# Patient Record
Sex: Female | Born: 1950 | Smoking: Never smoker
Health system: Southern US, Community
[De-identification: ages and names within clinical notes are randomized; demographics above are authoritative.]

## PROBLEM LIST (undated history)

## (undated) DIAGNOSIS — G35D Multiple sclerosis, unspecified: Secondary | ICD-10-CM

## (undated) DIAGNOSIS — G35 Multiple sclerosis: Secondary | ICD-10-CM

## (undated) DIAGNOSIS — D649 Anemia, unspecified: Secondary | ICD-10-CM

## (undated) DIAGNOSIS — F329 Major depressive disorder, single episode, unspecified: Secondary | ICD-10-CM

---

## 2015-12-18 ENCOUNTER — Emergency Department (HOSPITAL_COMMUNITY): Payer: Medicare Other

## 2015-12-18 ENCOUNTER — Encounter (HOSPITAL_COMMUNITY): Payer: Self-pay | Admitting: Nurse Practitioner

## 2015-12-18 ENCOUNTER — Inpatient Hospital Stay (HOSPITAL_COMMUNITY)
Admission: EM | Admit: 2015-12-18 | Discharge: 2015-12-28 | DRG: 871 | Disposition: A | Discharge status: Skilled Nursing Facility | Payer: Medicare Other | Attending: Internal Medicine | Admitting: Internal Medicine

## 2015-12-18 DIAGNOSIS — R740 Nonspecific elevation of levels of transaminase and lactic acid dehydrogenase [LDH]: Secondary | ICD-10-CM | POA: Diagnosis not present

## 2015-12-18 DIAGNOSIS — E860 Dehydration: Secondary | ICD-10-CM | POA: Diagnosis present

## 2015-12-18 DIAGNOSIS — L89154 Pressure ulcer of sacral region, stage 4: Secondary | ICD-10-CM | POA: Diagnosis not present

## 2015-12-18 DIAGNOSIS — L8990 Pressure ulcer of unspecified site, unspecified stage: Secondary | ICD-10-CM | POA: Diagnosis not present

## 2015-12-18 DIAGNOSIS — E870 Hyperosmolality and hypernatremia: Secondary | ICD-10-CM | POA: Diagnosis not present

## 2015-12-18 DIAGNOSIS — R52 Pain, unspecified: Secondary | ICD-10-CM | POA: Diagnosis present

## 2015-12-18 DIAGNOSIS — Y95 Nosocomial condition: Secondary | ICD-10-CM | POA: Diagnosis present

## 2015-12-18 DIAGNOSIS — Z66 Do not resuscitate: Secondary | ICD-10-CM | POA: Diagnosis not present

## 2015-12-18 DIAGNOSIS — Z515 Encounter for palliative care: Secondary | ICD-10-CM | POA: Diagnosis present

## 2015-12-18 DIAGNOSIS — J9601 Acute respiratory failure with hypoxia: Secondary | ICD-10-CM | POA: Diagnosis present

## 2015-12-18 DIAGNOSIS — F4321 Adjustment disorder with depressed mood: Secondary | ICD-10-CM | POA: Diagnosis present

## 2015-12-18 DIAGNOSIS — R532 Functional quadriplegia: Secondary | ICD-10-CM | POA: Diagnosis present

## 2015-12-18 DIAGNOSIS — Z7189 Other specified counseling: Secondary | ICD-10-CM | POA: Diagnosis not present

## 2015-12-18 DIAGNOSIS — A419 Sepsis, unspecified organism: Principal | ICD-10-CM | POA: Diagnosis present

## 2015-12-18 DIAGNOSIS — R509 Fever, unspecified: Secondary | ICD-10-CM

## 2015-12-18 DIAGNOSIS — J1100 Influenza due to unidentified influenza virus with unspecified type of pneumonia: Secondary | ICD-10-CM | POA: Diagnosis not present

## 2015-12-18 DIAGNOSIS — Z23 Encounter for immunization: Secondary | ICD-10-CM | POA: Diagnosis not present

## 2015-12-18 DIAGNOSIS — K5641 Fecal impaction: Secondary | ICD-10-CM | POA: Diagnosis not present

## 2015-12-18 DIAGNOSIS — E876 Hypokalemia: Secondary | ICD-10-CM | POA: Diagnosis present

## 2015-12-18 DIAGNOSIS — J189 Pneumonia, unspecified organism: Secondary | ICD-10-CM | POA: Diagnosis present

## 2015-12-18 DIAGNOSIS — Z7401 Bed confinement status: Secondary | ICD-10-CM | POA: Diagnosis not present

## 2015-12-18 DIAGNOSIS — G35 Multiple sclerosis: Secondary | ICD-10-CM | POA: Diagnosis present

## 2015-12-18 DIAGNOSIS — G35D Multiple sclerosis, unspecified: Secondary | ICD-10-CM | POA: Diagnosis present

## 2015-12-18 DIAGNOSIS — D72829 Elevated white blood cell count, unspecified: Secondary | ICD-10-CM | POA: Diagnosis present

## 2015-12-18 HISTORY — DX: Multiple sclerosis: G35

## 2015-12-18 HISTORY — DX: Multiple sclerosis, unspecified: G35.D

## 2015-12-18 HISTORY — DX: Anemia, unspecified: D64.9

## 2015-12-18 HISTORY — DX: Major depressive disorder, single episode, unspecified: F32.9

## 2015-12-18 LAB — I-STAT VENOUS BLOOD GAS, ED
Acid-Base Excess: 6 mmol/L — ABNORMAL HIGH (ref 0.0–2.0)
BICARBONATE: 30.8 meq/L — AB (ref 20.0–24.0)
O2 Saturation: 99 %
PCO2 VEN: 50 mmHg (ref 45.0–50.0)
PH VEN: 7.408 — AB (ref 7.250–7.300)
PO2 VEN: 128 mmHg — AB (ref 31.0–45.0)
TCO2: 32 mmol/L (ref 0–100)

## 2015-12-18 LAB — URINE MICROSCOPIC-ADD ON

## 2015-12-18 LAB — PHOSPHORUS: PHOSPHORUS: 3.7 mg/dL (ref 2.5–4.6)

## 2015-12-18 LAB — COMPREHENSIVE METABOLIC PANEL
ALBUMIN: 2.3 g/dL — AB (ref 3.5–5.0)
ALBUMIN: 1.9 g/dL — AB (ref 3.5–5.0)
ALK PHOS: 72 U/L (ref 38–126)
ALT: 35 U/L (ref 14–54)
ALT: 31 U/L (ref 14–54)
ANION GAP: 18 — AB (ref 5–15)
AST: 134 U/L — AB (ref 15–41)
AST: 115 U/L — AB (ref 15–41)
Alkaline Phosphatase: 87 U/L (ref 38–126)
Anion gap: 13 (ref 5–15)
BUN: 27 mg/dL — AB (ref 6–20)
BUN: 24 mg/dL — ABNORMAL HIGH (ref 6–20)
CALCIUM: 7.9 mg/dL — AB (ref 8.9–10.3)
CO2: 24 mmol/L (ref 22–32)
CO2: 27 mmol/L (ref 22–32)
CREATININE: 0.97 mg/dL (ref 0.44–1.00)
Calcium: 8.7 mg/dL — ABNORMAL LOW (ref 8.9–10.3)
Chloride: 109 mmol/L (ref 101–111)
Chloride: 112 mmol/L — ABNORMAL HIGH (ref 101–111)
Creatinine, Ser: 1 mg/dL (ref 0.44–1.00)
GFR calc Af Amer: >60 mL/min (ref >60)
GFR calc non Af Amer: 59 mL/min — ABNORMAL LOW (ref >60)
GFR calc non Af Amer: >60 mL/min (ref >60)
GLUCOSE: 116 mg/dL — AB (ref 65–99)
Glucose, Bld: 119 mg/dL — ABNORMAL HIGH (ref 65–99)
POTASSIUM: 4 mmol/L (ref 3.5–5.1)
Potassium: 3.8 mmol/L (ref 3.5–5.1)
SODIUM: 151 mmol/L — AB (ref 135–145)
Sodium: 152 mmol/L — ABNORMAL HIGH (ref 135–145)
Total Bilirubin: 1.9 mg/dL — ABNORMAL HIGH (ref 0.3–1.2)
Total Bilirubin: 1.8 mg/dL — ABNORMAL HIGH (ref 0.3–1.2)
Total Protein: 6.9 g/dL (ref 6.5–8.1)
Total Protein: 5.8 g/dL — ABNORMAL LOW (ref 6.5–8.1)

## 2015-12-18 LAB — CBC WITH DIFFERENTIAL/PLATELET
BASOS ABS: 0 10*3/uL (ref 0.0–0.1)
BASOS ABS: 0 10*3/uL (ref 0.0–0.1)
BASOS PCT: 0 %
BASOS PCT: 0 %
EOS ABS: 0 10*3/uL (ref 0.0–0.7)
EOS PCT: 0 %
EOS PCT: 0 %
Eosinophils Absolute: 0 10*3/uL (ref 0.0–0.7)
HCT: 40.9 % (ref 36.0–46.0)
HEMATOCRIT: 36.3 % (ref 36.0–46.0)
HEMOGLOBIN: 12.8 g/dL (ref 12.0–15.0)
Hemoglobin: 11.2 g/dL — ABNORMAL LOW (ref 12.0–15.0)
LYMPHS ABS: 1.6 10*3/uL (ref 0.7–4.0)
LYMPHS PCT: 10 %
Lymphocytes Relative: 17 %
Lymphs Abs: 2.4 10*3/uL (ref 0.7–4.0)
MCH: 28.3 pg (ref 26.0–34.0)
MCH: 27.9 pg (ref 26.0–34.0)
MCHC: 31.3 g/dL (ref 30.0–36.0)
MCHC: 30.9 g/dL (ref 30.0–36.0)
MCV: 90.3 fL (ref 78.0–100.0)
MCV: 90.5 fL (ref 78.0–100.0)
MONO ABS: 1.2 10*3/uL — AB (ref 0.1–1.0)
MONOS PCT: 8 %
Monocytes Absolute: 1.3 10*3/uL — ABNORMAL HIGH (ref 0.1–1.0)
Monocytes Relative: 9 %
NEUTROS ABS: 12.6 10*3/uL — AB (ref 1.7–7.7)
Neutro Abs: 10.5 10*3/uL — ABNORMAL HIGH (ref 1.7–7.7)
Neutrophils Relative %: 81 %
Neutrophils Relative %: 74 %
PLATELETS: 298 10*3/uL (ref 150–400)
PLATELETS: 248 10*3/uL (ref 150–400)
RBC: 4.53 MIL/uL (ref 3.87–5.11)
RBC: 4.01 MIL/uL (ref 3.87–5.11)
RDW: 15 % (ref 11.5–15.5)
RDW: 15 % (ref 11.5–15.5)
WBC: 15.6 10*3/uL — AB (ref 4.0–10.5)
WBC: 14.1 10*3/uL — ABNORMAL HIGH (ref 4.0–10.5)

## 2015-12-18 LAB — URINALYSIS, ROUTINE W REFLEX MICROSCOPIC
Glucose, UA: NEGATIVE mg/dL
Ketones, ur: 15 mg/dL — AB
NITRITE: NEGATIVE
PROTEIN: 100 mg/dL — AB
SPECIFIC GRAVITY, URINE: 1.028 (ref 1.005–1.030)
pH: 6 (ref 5.0–8.0)

## 2015-12-18 LAB — LACTIC ACID, PLASMA
LACTIC ACID, VENOUS: 1.7 mmol/L (ref 0.5–2.0)
Lactic Acid, Venous: 1.4 mmol/L (ref 0.5–2.0)

## 2015-12-18 LAB — APTT: APTT: 38 s — AB (ref 24–37)

## 2015-12-18 LAB — I-STAT CG4 LACTIC ACID, ED: LACTIC ACID, VENOUS: 1.99 mmol/L (ref 0.5–2.0)

## 2015-12-18 LAB — MAGNESIUM: MAGNESIUM: 2 mg/dL (ref 1.7–2.4)

## 2015-12-18 LAB — MRSA PCR SCREENING: MRSA by PCR: NEGATIVE

## 2015-12-18 LAB — PROTIME-INR
INR: 1.67 — ABNORMAL HIGH (ref 0.00–1.49)
Prothrombin Time: 19.7 seconds — ABNORMAL HIGH (ref 11.6–15.2)

## 2015-12-18 LAB — PROCALCITONIN: Procalcitonin: 0.21 ng/mL

## 2015-12-18 MED ORDER — ONDANSETRON HCL 4 MG PO TABS: 4.0000 mg | ORAL_TABLET | Freq: Four times a day (QID) | ORAL | Status: DC | PRN

## 2015-12-18 MED ORDER — DEXTROSE 5 % IV SOLN
2.0000 g | Freq: Once | INTRAVENOUS | Status: AC
  Administered 2015-12-18: 2 g via INTRAVENOUS
  Filled 2015-12-18: qty 2

## 2015-12-18 MED ORDER — VANCOMYCIN HCL IN DEXTROSE 750-5 MG/150ML-% IV SOLN
750.0000 mg | Freq: Two times a day (BID) | INTRAVENOUS | Status: DC
  Administered 2015-12-19 – 2015-12-25 (×14): 750 mg via INTRAVENOUS
  Filled 2015-12-18 (×16): qty 150

## 2015-12-18 MED ORDER — ALBUTEROL SULFATE (5 MG/ML) 0.5% IN NEBU: 1.2500 mg | INHALATION_SOLUTION | Freq: Four times a day (QID) | RESPIRATORY_TRACT | Status: DC | PRN

## 2015-12-18 MED ORDER — ONDANSETRON HCL 4 MG/2ML IJ SOLN
4.0000 mg | Freq: Four times a day (QID) | INTRAMUSCULAR | Status: DC | PRN
  Administered 2015-12-19: 4 mg via INTRAVENOUS
  Filled 2015-12-18: qty 2

## 2015-12-18 MED ORDER — DEXTROSE 5 % IV SOLN
2.0000 g | Freq: Three times a day (TID) | INTRAVENOUS | Status: DC
  Administered 2015-12-18 – 2015-12-21 (×8): 2 g via INTRAVENOUS
  Filled 2015-12-18 (×10): qty 2

## 2015-12-18 MED ORDER — AZTREONAM 2 G IJ SOLR
2.0000 g | Freq: Once | INTRAMUSCULAR | Status: DC
  Filled 2015-12-18: qty 2

## 2015-12-18 MED ORDER — ENOXAPARIN SODIUM 40 MG/0.4ML ~~LOC~~ SOLN
40.0000 mg | SUBCUTANEOUS | Status: DC
  Administered 2015-12-18 – 2015-12-28 (×7): 40 mg via SUBCUTANEOUS
  Filled 2015-12-18 (×9): qty 0.4

## 2015-12-18 MED ORDER — ASPIRIN EC 81 MG PO TBEC
81.0000 mg | DELAYED_RELEASE_TABLET | Freq: Every day | ORAL | Status: DC
  Administered 2015-12-18 – 2015-12-28 (×8): 81 mg via ORAL
  Filled 2015-12-18 (×11): qty 1

## 2015-12-18 MED ORDER — AZTREONAM 2 G IJ SOLR
2.0000 g | Freq: Three times a day (TID) | INTRAMUSCULAR | Status: DC
  Filled 2015-12-18: qty 2

## 2015-12-18 MED ORDER — SODIUM CHLORIDE 0.9 % IV SOLN
1250.0000 mg | Freq: Once | INTRAVENOUS | Status: AC
  Administered 2015-12-18: 1250 mg via INTRAVENOUS
  Filled 2015-12-18: qty 1250

## 2015-12-18 MED ORDER — SODIUM CHLORIDE 0.9 % IV BOLUS (SEPSIS)
1000.0000 mL | INTRAVENOUS | Status: AC
  Administered 2015-12-18 (×2): 1000 mL via INTRAVENOUS

## 2015-12-18 MED ORDER — SODIUM CHLORIDE 0.9 % IV BOLUS (SEPSIS)
1000.0000 mL | Freq: Once | INTRAVENOUS | Status: AC
  Administered 2015-12-18: 1000 mL via INTRAVENOUS

## 2015-12-18 MED ORDER — GUAIFENESIN ER 600 MG PO TB12
600.0000 mg | ORAL_TABLET | Freq: Two times a day (BID) | ORAL | Status: DC
Start: 2015-12-18 — End: 2015-12-29
  Administered 2015-12-18 – 2015-12-28 (×11): 600 mg via ORAL
  Filled 2015-12-18 (×20): qty 1

## 2015-12-18 MED ORDER — LEVALBUTEROL HCL 1.25 MG/0.5ML IN NEBU: 1.2500 mg | INHALATION_SOLUTION | Freq: Four times a day (QID) | RESPIRATORY_TRACT | Status: DC | PRN

## 2015-12-18 MED ORDER — ACETAMINOPHEN 650 MG RE SUPP
650.0000 mg | Freq: Once | RECTAL | Status: AC
  Administered 2015-12-18: 650 mg via RECTAL
  Filled 2015-12-18: qty 1

## 2015-12-18 MED ORDER — SODIUM CHLORIDE 0.9 % IV SOLN
INTRAVENOUS | Status: DC
  Administered 2015-12-18: 15:00:00 via INTRAVENOUS

## 2015-12-18 MED ORDER — HYDROCODONE-ACETAMINOPHEN 5-325 MG PO TABS: 1.0000 | ORAL_TABLET | ORAL | Status: DC | PRN

## 2015-12-18 MED ORDER — ACETAMINOPHEN 325 MG PO TABS
650.0000 mg | ORAL_TABLET | Freq: Four times a day (QID) | ORAL | Status: DC | PRN
  Administered 2015-12-21: 650 mg via ORAL
  Filled 2015-12-18 (×2): qty 2

## 2015-12-18 MED ORDER — SENNOSIDES-DOCUSATE SODIUM 8.6-50 MG PO TABS: 1.0000 | ORAL_TABLET | Freq: Every evening | ORAL | Status: DC | PRN

## 2015-12-18 MED ORDER — SODIUM CHLORIDE 0.9 % IV BOLUS (SEPSIS)
500.0000 mL | INTRAVENOUS | Status: AC
  Administered 2015-12-18: 500 mL via INTRAVENOUS

## 2015-12-18 MED ORDER — ACETAMINOPHEN 650 MG RE SUPP
650.0000 mg | Freq: Four times a day (QID) | RECTAL | Status: DC | PRN
  Administered 2015-12-20: 650 mg via RECTAL
  Filled 2015-12-18: qty 1

## 2015-12-18 MED ORDER — MORPHINE SULFATE (PF) 2 MG/ML IV SOLN
1.0000 mg | INTRAVENOUS | Status: DC | PRN
  Administered 2015-12-19: 1 mg via INTRAVENOUS
  Filled 2015-12-18 (×2): qty 1

## 2015-12-18 NOTE — Progress Notes (Addendum)
Pharmacy Antibiotic Note  Sheena Porter is a 65 y.o. female admitted on 12/18/2015 with sepsis.  Pharmacy has been consulted for vancomycin and aztreonam dosing.  PMH MS depression, anemia  Allergy to PCN with unknown rxn. Previously on doxy 100 mg q12h started on 3/6 for prescribed duration of 10 days. Facility also reported pt being on ertapenem. Lactate 1.99, Tmax 103.2. WBC 15.6. Pt has wound infections on sacrum & RLE, arrived to ED diaphoretic and with mental status changes.  Given no Cr, will give one time doses and order scheduled regimen after CMet results.   Plan: Vancomycin 1250 mg bolus x 1 Aztreonam 2 g x 1  Height: 5' (152.4 cm) Weight: 179 lb (81.194 kg) IBW/kg (Calculated) : 45.5  Temp (24hrs), Avg:103.2 F (39.6 C), Min:103.2 F (39.6 C), Max:103.2 F (39.6 C)   Recent Labs Lab 12/18/15 1108 12/18/15 1158  WBC  --  15.6*  LATICACIDVEN 1.99  --     CrCl cannot be calculated (Patient has no serum creatinine result on file.).    Allergies  Allergen Reactions  . Penicillins Other (See Comments)  . Tape     Antimicrobials this admission: 3/12 vanc >>  3/12 aztreonam >>   Dose adjustments this admission: none  Microbiology results: 3/12 BCx:  3/12 UCx:     Thank you for allowing pharmacy to be a part of this patient's care.  Floria Raveling 12/18/2015 12:37 PM _______________________________________________________ ADDENDUM:   Labs resulted: SCr 1, CrCl ~ 55 mL/min  Plan:  -Vancomycin 1250 mg x 1 and Aztreonam 2g x 1 -Vancomycin IV 750 mg q12h -Aztreonam IV 2g q8h -f/u renal function, C&S, LOT, and VT at [facility]

## 2015-12-18 NOTE — Progress Notes (Signed)
Admission note:  Arrival Method: Stretcher from ED Mental Orientation:A&OX2 Telemetry: [phone] CCMD notified Assessment: See flowsheet Skin: Pressure ulcers; see flowsheet. WOC RN consulted.  IV: R forearm; R hand NS @ 55ml/hr Pain: Denies Tubes: Foley cath Safety Measures: bed in lowest position, call light within reach Fall Prevention Safety Plan:  Admission Screening: To be completed; pt not fully oriented 6700 Orientation: Patient has been oriented to the unit, staff and to the room.   Orders have been reviewed and implemented. Will continue to assess and monitor pt.   Lujean Amel

## 2015-12-18 NOTE — H&P (Signed)
[facility] History and Physical  Sheena Porter YQM:[id number] DOB: 29-Oct-1950 DOA: 12/18/2015   PCP: Katy Apo, MD   Chief Complaint: Shortness of breath, confusion  HPI: Sheena Porter is a 65 y.o. female with a h/o MS, anemia, brought today via EMS from [facility] rehabilitation due to fever and worsening sacral wounds, right lower leg wounds. The patient was receiving doxycycline 100 mg twice a day for 6 days and Invanz 1 g IV IM x 4 doses prior to presentation. History is obtained from son, as patient is lethargic.Family reported that for the last 2 days, she had significant decline of her mental status, and oral intake and activity. They are not aware of any sick contacts. She is bedbound due to MS.   At the ER, Her temp was up to 103, she was tachycardic with a rate of 124, and she was hypoxic, requiring in the mask 8 L. Initial lactate was 1.99, white count elevated at 15.6, with the ANC of 12. Chemistries remarkable for hypernatremia and abnl LFTs Chest x-ray suspicious for  right lower lobe pneumonia. She received Vanco and aztreonam in the ED due to unknown penicillin allergy. She also received IV fluids, (2.5 L at the ED). Cultures pending. Code sepsis was called. She is to be admitted to stepdown for further management.    Review of Systems:  Unable to obtain, patient is lethargic, falls asleep easily.   Past Medical History  Diagnosis Date  . Multiple sclerosis (HCC)   . MDD (major depressive disorder) (HCC)   . Anemia    History reviewed. No pertinent past surgical history. Social History:  reports that she has never smoked. She does not have any smokeless tobacco history on file. She reports that she does not drink alcohol. Her drug history is not on file.  Allergies  Allergen Reactions  . Penicillins Other (See Comments)  . Tape     No family history on file.   Prior to Admission medications   Not on File   Physical Exam: Filed Vitals:   12/18/15 1330 12/18/15 1339 12/18/15 1345 12/18/15 1400  BP: 113/70  104/62 107/62  Pulse: 101 110 99 97  Temp:  98.9 F (37.2 C)    TempSrc:  Rectal    Resp: Height:      Weight:      SpO2: 99% 100% 96% 97%    Wt Readings from Last 3 Encounters:  12/18/15 81.194 kg (179 lb)    General: Appears lethargic, opens and closes eyes, falls asleep easily, but rouses to voice and touch  Eyes:  PERRL, EOMI, normal lids, iris/ sclera mildly icteric.  ENT: grossly normal hearing, lips. Patient on mask. Neck: no lymphadenopathy, masses or thyromegaly Cardiovascular: tachy,reg rythm, no murmurs, rubs or gallops. No lower extremity edema   Respiratory:decreased breath sounds on the right , no wheezing, rhonhci +rales. Normal respiratory effort. Abdomen: soft,non-tender, normal bowel sounds. Foley to gravity with orange colored urine Skin: Right heel ulcer. LLE with minimal skin breakdown. Known 15 cm presacral wound. Multiple areas of excoriation on both lower extremities.  Musculoskeletal: patient has MS, decreased muscle tone Psychiatric: grossly normal mood and affect, speech fluent and appropriate Neurologic: unable to move extremities due to MS.           Labs on Admission:  Basic Metabolic Panel:  Recent Labs Lab 12/18/15 1158  NA 151*  K 4.0  CL 109  CO2 24  GLUCOSE 116*  BUN  27*  CREATININE 1.00  CALCIUM 8.7*    Liver Function Tests:  Recent Labs Lab 12/18/15 1158  AST 134*  ALT 35  ALKPHOS 87  BILITOT 1.9*  PROT 6.9  ALBUMIN 2.3*   No results for input(s): LIPASE, AMYLASE in the last 168 hours. No results for input(s): AMMONIA in the last 168 hours.  CBC:  Recent Labs Lab 12/18/15 1158  WBC 15.6*  NEUTROABS 12.6*  HGB 12.8  HCT 40.9  MCV 90.3  PLT 298    Cardiac Enzymes: No results for input(s): CKTOTAL, CKMB, CKMBINDEX, TROPONINI in the last 168 hours.  BNP (last 3 results) No results for input(s): BNP in the last 8760  hours.  ProBNP (last 3 results) No results for input(s): PROBNP in the last 8760 hours.   CREATININE: 1 (12/18/15 1158) Estimated creatinine clearance - 54.4 mL/min  CBG: No results for input(s): GLUCAP in the last 168 hours.  Radiological Exams on Admission: Dg Chest Port 1 View  12/18/2015  CLINICAL DATA:  65 year old female with history of sepsis. EXAM: PORTABLE CHEST 1 VIEW COMPARISON:  No priors. FINDINGS: Left lower lobe airspace consolidation with air bronchograms. Linear scarring or subsegmental atelectasis in the right mid lung. No pleural effusions. No evidence of pulmonary edema. Heart size and mediastinal contours are within normal limits. IMPRESSION: 1. Right lower lobe consolidation and air bronchograms, concerning for pneumonia. Followup PA and lateral chest X-ray is recommended in 3-4 weeks following trial of antibiotic therapy to ensure resolution and exclude underlying malignancy. Electronically Signed   By: Trudie Reed M.D.   On: 12/18/2015 12:02    EKG: Independently reviewed.    Assessment/Plan Principal Problem:   Sepsis due to pneumonia [facility]) Active Problems:   HCAP (healthcare-associated pneumonia)   Multiple sclerosis (HCC)   Leukocytosis     Acute respiratory failure with hypoxia due to Healthcare associated pneumonia. Chest x-ray as noted above. lactic acid within the limits of normal, maximum temperature 103.2, currently at 98.9,  Oxygen saturation level  88 on room air, now on NRB 8 l. -Antibiotics per protocol with Aztreonam and Vanco -Blood cultures -Strep pneumo urine antigen -Legionella urine antigen -Oxygen supplementation and wean as able -Nebulizers with Xopenex every 6 hours prn Symbicort BID    Sepsis of Unclear Etiology. In the setting of RLL HCAP. Rule out Urine source, pressure ulcer.   Patient meets sepsis criteria based on vital sign changes lactic acidosis and hypoxia. Patient's acute respiratory failure likely secondary to  RLL  pneumonia which is  Seen on cxr. UA pending, Initial  Lactic acid 1.99.  T max 103.2 CBC remarkable for leukocytosis, WBC 15.  patient's ABG notable for pH 7.4 , PCO2 50, PO2 128, bicarbonate 30.8.  EKG Sinus Tach, QTC 422. Will place patient on oxygen and titrate O2 amount and delivery system as needed. She is on 8 l NRB. Received 2.5 l NS to date Sepsis order set  Droplet precaution IV antibiotics by pharmacy with Aztreonam and Vanco Xopenex Follow lactic acid Follow blood and urine cultures IV fluids at 100 cc/h.  Procalcitonin UA, cultures    Hypernatremia - likely due to dehydration, sodium 151, Cr 1.0  Continue hydration  -Check a.m. BMP -Check magnesium and phosphorus   Leukocytosis, likely related to underlying infection Cultures pending -  abx as above -  Repeat WBC in AM Continue Vanc/astreonam per Pharmacy   Pressure Ulcers Obtain Wound Care consult  May need wound culture  Abnormal AST, in the setting  of sepsis, dehydration. T bil 1.9 , AST 134 Continue hydration and repeat CMET in am.   Code Status: Full Code DVT Prophylaxis: Lovenox  Family Communication: at bedside Disposition Plan: Pending Improvement. Admitted for observation in tele bed. Expected LOS 24-48 hrs    [facility] E,PA-C [facility] [email or web address] Password [email or web address]

## 2015-12-18 NOTE — ED Provider Notes (Signed)
Patient sent from rehabilitation facility with decreased mental status and fever. Patient has had diminished level of consciousness for the past 2 days. Noted to be febrile today at rehabilitation facility. On exam patient is alert, answers yes no questions acutely ill-appearing HEENT exam mucous memory is dry pink nonicteric neck supple lungs clear auscultation heart regular rate and rhythm tachycardic abdomen nondistended nontender right lower extremity with a centimeter decubitus ulcer at heel. DP pulse 2+. Left lower extremity with minor minimal skin breakdown at heel and at great toe. DP pulse 2+. Back there is minor skin breakdown at the right posterior thorax and a large decubitus ulcer approximately 15 cm diameter at the presacral area neurologic. She follows simple commands. She is alert and awake cranial nerves II through XII grossly intact, answers yes no questions Code sepsis called based on sirs criteria temperature, tachycardia. Source of infection currently unknown possibilities include skin, respiratory, urine Chest x-ray reviewed by me  12: 50 PM patient is more alert and talkative after treatment with intravenous fluids antibiotics are currently being hung. Results for orders placed or performed during the hospital encounter of 12/18/15  Comprehensive metabolic panel  Result Value Ref Range   Sodium 151 (H) 135 - 145 mmol/L   Potassium 4.0 3.5 - 5.1 mmol/L   Chloride 109 101 - 111 mmol/L   CO2 24 22 - 32 mmol/L   Glucose, Bld 116 (H) 65 - 99 mg/dL   BUN 27 (H) 6 - 20 mg/dL   Creatinine, Ser 1.19 0.44 - 1.00 mg/dL   Calcium 8.7 (L) 8.9 - 10.3 mg/dL   Total Protein 6.9 6.5 - 8.1 g/dL   Albumin 2.3 (L) 3.5 - 5.0 g/dL   AST 147 (H) 15 - 41 U/L   ALT 35 14 - 54 U/L   Alkaline Phosphatase 87 38 - 126 U/L   Total Bilirubin 1.9 (H) 0.3 - 1.2 mg/dL   GFR calc non Af Amer 59 (L) >60 mL/min   GFR calc Af Amer >60 >60 mL/min   Anion gap 18 (H) 5 - 15  CBC WITH DIFFERENTIAL  Result  Value Ref Range   WBC 15.6 (H) 4.0 - 10.5 K/uL   RBC 4.53 3.87 - 5.11 MIL/uL   Hemoglobin 12.8 12.0 - 15.0 g/dL   HCT 82.9 56.2 - 13.0 %   MCV 90.3 78.0 - 100.0 fL   MCH 28.3 26.0 - 34.0 pg   MCHC 31.3 30.0 - 36.0 g/dL   RDW 86.5 78.4 - 69.6 %   Platelets 298 150 - 400 K/uL   Neutrophils Relative % 81 %   Neutro Abs 12.6 (H) 1.7 - 7.7 K/uL   Lymphocytes Relative 10 %   Lymphs Abs 1.6 0.7 - 4.0 K/uL   Monocytes Relative 9 %   Monocytes Absolute 1.3 (H) 0.1 - 1.0 K/uL   Eosinophils Relative 0 %   Eosinophils Absolute 0.0 0.0 - 0.7 K/uL   Basophils Relative 0 %   Basophils Absolute 0.0 0.0 - 0.1 K/uL  I-Stat CG4 Lactic Acid, ED  (not at  [facility])  Result Value Ref Range   Lactic Acid, Venous 1.99 0.5 - 2.0 mmol/L  I-Stat venous blood gas, ED ([facility], [facility])  Result Value Ref Range   pH, Ven 7.408 (H) 7.250 - 7.300   pCO2, Ven 50.0 45.0 - 50.0 mmHg   pO2, Ven 128.0 (H) 31.0 - 45.0 mmHg   Bicarbonate 30.8 (H) 20.0 - 24.0 mEq/L   TCO2 32 0 -  100 mmol/L   O2 Saturation 99.0 %   Acid-Base Excess 6.0 (H) 0.0 - 2.0 mmol/L   Patient temperature 103.2 F    Sample type VENOUS    Dg Chest Port 1 View  12/18/2015  CLINICAL DATA:  65 year old female with history of sepsis. EXAM: PORTABLE CHEST 1 VIEW COMPARISON:  No priors. FINDINGS: Left lower lobe airspace consolidation with air bronchograms. Linear scarring or subsegmental atelectasis in the right mid lung. No pleural effusions. No evidence of pulmonary edema. Heart size and mediastinal contours are within normal limits. IMPRESSION: 1. Right lower lobe consolidation and air bronchograms, concerning for pneumonia. Followup PA and lateral chest X-ray is recommended in 3-4 weeks following trial of antibiotic therapy to ensure resolution and exclude underlying malignancy. Electronically Signed   By: Trudie Reed M.D.   On: 12/18/2015 12:02     Doug Sou, MD 12/18/15 [phone]

## 2015-12-18 NOTE — ED Notes (Signed)
Admitting at bedside 

## 2015-12-18 NOTE — ED Notes (Addendum)
Per EMS pt from [facility] for MS and wounds so sacrum and right lower leg. Patient has urinary catheter. Patient was being treated with abx for wounds and spike fever today. Patient baseline is normally "fiesty" and today is docile. Patient diaphoretic upon scene.   Pt tx with docycycline 100mg  BID x 6 days and Invanz 1g IM x 4 doses.

## 2015-12-18 NOTE — ED Notes (Signed)
[facility] notified on code sepsis.

## 2015-12-18 NOTE — ED Provider Notes (Signed)
CSN: [id number]     Arrival date & time 12/18/15  1107 History   First MD Initiated Contact with Patient 12/18/15 1134     Chief Complaint  Patient presents with  . Code Sepsis   (Consider location/radiation/quality/duration/timing/severity/associated sxs/prior Treatment) HPI 65 y.o. female with a hx of MS, presents to the Emergency Department today via EMS from [facility] rehab due to fever and worsening sacral wounds, right lower leg wounds. Given Doxy  BID x 6 days and Invanz 1g IM 4 doses for wounds. Patient baseline is normally "feisty."   Per family, pt began declining in mental status, PO intake, activity on Friday. Normally verbal and active. Pt is Full Code.  Level V Caveat: Acuity of Condition  No past medical history on file. No past surgical history on file. No family history on file. Social History  Substance Use Topics  . Smoking status: Not on file  . Smokeless tobacco: Not on file  . Alcohol Use: Not on file   OB History    No data available     Review of Systems  Unable to perform ROS: Acuity of condition   Allergies  Review of patient's allergies indicates not on file.  Home Medications   Prior to Admission medications   Not on File   BP 111/75 mmHg  Pulse 124  Temp(Src) 103.2 F (39.6 C) (Rectal)  Resp 17  SpO2 96%   Physical Exam  Constitutional: She appears well-developed and well-nourished.  HENT:  Head: Normocephalic and atraumatic.  Eyes: EOM are normal. Pupils are equal, round, and reactive to light.  Neck: Normal range of motion. Neck supple. No tracheal deviation present.  Cardiovascular: Normal rate, regular rhythm, normal heart sounds and intact distal pulses.   No murmur heard. Pulmonary/Chest: Effort normal. She has no wheezes. She has no rhonchi. She has rales in the right lower field.  NRB Mask 8L  Abdominal: Soft. Normal appearance and bowel sounds are normal. There is no tenderness. There is no rigidity, no guarding, no  tenderness at McBurney's point and negative Murphy's sign.  Musculoskeletal: Normal range of motion.  Decubitus Ulcers noted 5-6cm. Erythema noted. No streaking. No Drainage.   Neurological: She is alert. GCS eye subscore is 4. GCS verbal subscore is 2. GCS motor subscore is 6.  Skin: Skin is warm and dry.  Psychiatric: She has a normal mood and affect. Her behavior is normal. Thought content normal.  Nursing note and vitals reviewed.  ED Course  Procedures (including critical care time) CRITICAL CARE Performed by: Eston Esters  Total critical care time: 40 minutes  Critical care time was exclusive of separately billable procedures and treating other patients.  Critical care was necessary to treat or prevent imminent or life-threatening deterioration.  Critical care was time spent personally by me on the following activities: development of treatment plan with patient and/or surrogate as well as nursing, discussions with consultants, evaluation of patient's response to treatment, examination of patient, obtaining history from patient or surrogate, ordering and performing treatments and interventions, ordering and review of laboratory studies, ordering and review of radiographic studies, pulse oximetry and re-evaluation of patient's condition.  Labs Review Labs Reviewed  COMPREHENSIVE METABOLIC PANEL - Abnormal; Notable for the following:    Sodium 151 (*)    Glucose, Bld 116 (*)    BUN 27 (*)    Calcium 8.7 (*)    Albumin 2.3 (*)    AST 134 (*)    Total Bilirubin 1.9 (*)  GFR calc non Af Amer 59 (*)    Anion gap 18 (*)    All other components within normal limits  CBC WITH DIFFERENTIAL/PLATELET - Abnormal; Notable for the following:    WBC 15.6 (*)    Neutro Abs 12.6 (*)    Monocytes Absolute 1.3 (*)    All other components within normal limits  I-STAT VENOUS BLOOD GAS, ED - Abnormal; Notable for the following:    pH, Ven 7.408 (*)    pO2, Ven 128.0 (*)    Bicarbonate 30.8  (*)    Acid-Base Excess 6.0 (*)    All other components within normal limits  CULTURE, BLOOD (ROUTINE X 2)  CULTURE, BLOOD (ROUTINE X 2)  URINE CULTURE  URINALYSIS, ROUTINE W REFLEX MICROSCOPIC (NOT AT [facility]  I-STAT CG4 LACTIC ACID, ED   Imaging Review Dg Chest Port 1 View  12/18/2015  CLINICAL DATA:  65 year old female with history of sepsis. EXAM: PORTABLE CHEST 1 VIEW COMPARISON:  No priors. FINDINGS: Left lower lobe airspace consolidation with air bronchograms. Linear scarring or subsegmental atelectasis in the right mid lung. No pleural effusions. No evidence of pulmonary edema. Heart size and mediastinal contours are within normal limits. IMPRESSION: 1. Right lower lobe consolidation and air bronchograms, concerning for pneumonia. Followup PA and lateral chest X-ray is recommended in 3-4 weeks following trial of antibiotic therapy to ensure resolution and exclude underlying malignancy. Electronically Signed   By: Trudie Reed M.D.   On: 12/18/2015 12:02   I have personally reviewed and evaluated these images and lab results as part of my medical decision-making.   EKG Interpretation   Date/Time:  Sunday December 18 2015 11:40:36 EDT Ventricular Rate:  124 PR Interval:  133 QRS Duration: 73 QT Interval:  294 QTC Calculation: 422 R Axis:   24 Text Interpretation:  Sinus tachycardia Borderline low voltage, extremity  leads RSR' in V1 or V2, probably normal variant No old tracing to compare  Confirmed by Ethelda Chick  MD, SAM [phone]) on 12/18/2015 11:57:26 AM      MDM  I have reviewed and evaluated the relevant laboratory values.I have reviewed and evaluated the relevant imaging studies.I personally evaluated and interpreted the relevant EKG.I have reviewed the relevant previous healthcare records.I have reviewed EMS Documentation.I obtained HPI from historian. Patient discussed with supervising physician  ED Course:  Assessment: Pt is a 64yF with hx MS who presents Code Sepsis  due to Temp 103F. Tachycardia 124bpm. On exam, pt in altered with difficulty answer questioning and on NRB mask 8L. VS show tachycardia, normotensive, hypoxic. Febrile 103F. Lungs Crackles RLL. Heart RRR. Abdomen nontender soft. Decubitus ulcer noted on sacrum. Labs Initial lactate 1.99. WBC 15.6. Imaging RLL PNA. Given Vanc/Aztrenoam in ED due to unknown Penicillin allergy. 2.5L Given in ED. Plan is to admit to stepdown for further management.   12:33 PM- Spoke with Pharmacy. Due to unknown Penicillin allergy. Will start Aztreonam instead of Zosyn.   Disposition/Plan:  Admit to Stepdown Pt acknowledges and agrees with plan  Supervising Physician Doug Sou, MD   Final diagnoses:  Healthcare-associated pneumonia        Audry Pili, PA-C 12/18/15 1648  Doug Sou, MD 12/18/15 [phone]

## 2015-12-19 ENCOUNTER — Inpatient Hospital Stay (HOSPITAL_COMMUNITY): Payer: Medicare Other

## 2015-12-19 DIAGNOSIS — K5641 Fecal impaction: Secondary | ICD-10-CM

## 2015-12-19 LAB — CBC
HCT: 34 % — ABNORMAL LOW (ref 36.0–46.0)
Hemoglobin: 10.5 g/dL — ABNORMAL LOW (ref 12.0–15.0)
MCH: 27.6 pg (ref 26.0–34.0)
MCHC: 30.9 g/dL (ref 30.0–36.0)
MCV: 89.2 fL (ref 78.0–100.0)
PLATELETS: 243 10*3/uL (ref 150–400)
RBC: 3.81 MIL/uL — AB (ref 3.87–5.11)
RDW: 15.5 % (ref 11.5–15.5)
WBC: 15.5 10*3/uL — ABNORMAL HIGH (ref 4.0–10.5)

## 2015-12-19 LAB — COMPREHENSIVE METABOLIC PANEL
ALT: 27 U/L (ref 14–54)
ANION GAP: 9 (ref 5–15)
AST: 98 U/L — ABNORMAL HIGH (ref 15–41)
Albumin: 1.8 g/dL — ABNORMAL LOW (ref 3.5–5.0)
Alkaline Phosphatase: 65 U/L (ref 38–126)
BUN: 20 mg/dL (ref 6–20)
CALCIUM: 7.7 mg/dL — AB (ref 8.9–10.3)
CHLORIDE: 120 mmol/L — AB (ref 101–111)
CO2: 24 mmol/L (ref 22–32)
CREATININE: 0.57 mg/dL (ref 0.44–1.00)
Glucose, Bld: 88 mg/dL (ref 65–99)
POTASSIUM: 4.5 mmol/L (ref 3.5–5.1)
SODIUM: 153 mmol/L — AB (ref 135–145)
TOTAL PROTEIN: 5.2 g/dL — AB (ref 6.5–8.1)
Total Bilirubin: 1.1 mg/dL (ref 0.3–1.2)

## 2015-12-19 LAB — URINE CULTURE

## 2015-12-19 LAB — URINE MICROSCOPIC-ADD ON

## 2015-12-19 LAB — URINALYSIS, ROUTINE W REFLEX MICROSCOPIC
GLUCOSE, UA: NEGATIVE mg/dL
Hgb urine dipstick: NEGATIVE
KETONES UR: 15 mg/dL — AB
Leukocytes, UA: NEGATIVE
Nitrite: NEGATIVE
PH: 6 (ref 5.0–8.0)
Protein, ur: 30 mg/dL — AB
SPECIFIC GRAVITY, URINE: 1.035 — AB (ref 1.005–1.030)

## 2015-12-19 LAB — GLUCOSE, CAPILLARY: Glucose-Capillary: 93 mg/dL (ref 65–99)

## 2015-12-19 MED ORDER — DIVALPROEX SODIUM 125 MG PO CSDR
125.0000 mg | DELAYED_RELEASE_CAPSULE | Freq: Every day | ORAL | Status: DC
  Administered 2015-12-19 – 2015-12-25 (×7): 125 mg via ORAL
  Filled 2015-12-19 (×19): qty 1

## 2015-12-19 MED ORDER — CHLORHEXIDINE GLUCONATE 0.12 % MT SOLN
10.0000 mL | Freq: Two times a day (BID) | OROMUCOSAL | Status: DC
  Administered 2015-12-19 – 2015-12-23 (×7): 10 mL via OROMUCOSAL
  Filled 2015-12-19 (×12): qty 15

## 2015-12-19 MED ORDER — DEXTROSE-NACL 5-0.45 % IV SOLN
INTRAVENOUS | Status: DC
Start: 2015-12-19 — End: 2015-12-24
  Administered 2015-12-19 – 2015-12-24 (×5): via INTRAVENOUS

## 2015-12-19 MED ORDER — OXYCODONE HCL 5 MG PO TABS
5.0000 mg | ORAL_TABLET | Freq: Four times a day (QID) | ORAL | Status: DC | PRN
  Administered 2015-12-23 (×2): 5 mg via ORAL
  Filled 2015-12-19 (×2): qty 1

## 2015-12-19 MED ORDER — BOOST / RESOURCE BREEZE PO LIQD
1.0000 | Freq: Three times a day (TID) | ORAL | Status: DC
  Administered 2015-12-19 – 2015-12-23 (×5): 1 via ORAL

## 2015-12-19 MED ORDER — SACCHAROMYCES BOULARDII 250 MG PO CAPS
250.0000 mg | ORAL_CAPSULE | Freq: Every day | ORAL | Status: DC
  Administered 2015-12-19 – 2015-12-23 (×4): 250 mg via ORAL
  Filled 2015-12-19 (×9): qty 1

## 2015-12-19 MED ORDER — FLEET ENEMA 7-19 GM/118ML RE ENEM
1.0000 | ENEMA | Freq: Once | RECTAL | Status: AC
  Administered 2015-12-19: 1 via RECTAL
  Filled 2015-12-19: qty 1

## 2015-12-19 MED ORDER — DIVALPROEX SODIUM 125 MG PO CSDR
500.0000 mg | DELAYED_RELEASE_CAPSULE | Freq: Every day | ORAL | Status: DC
  Administered 2015-12-20 – 2015-12-25 (×4): 500 mg via ORAL
  Filled 2015-12-19 (×10): qty 4

## 2015-12-19 MED ORDER — WHITE PETROLATUM GEL
Status: AC
Start: 2015-12-19 — End: 2015-12-19
  Administered 2015-12-19: 0.2
  Filled 2015-12-19: qty 1

## 2015-12-19 MED ORDER — BACLOFEN 10 MG PO TABS
10.0000 mg | ORAL_TABLET | Freq: Three times a day (TID) | ORAL | Status: DC
  Administered 2015-12-19 – 2015-12-28 (×20): 10 mg via ORAL
  Filled 2015-12-19 (×26): qty 1

## 2015-12-19 MED ORDER — FENTANYL 25 MCG/HR TD PT72
25.0000 ug | MEDICATED_PATCH | TRANSDERMAL | Status: DC
  Administered 2015-12-20 – 2015-12-26 (×3): 25 ug via TRANSDERMAL
  Filled 2015-12-19 (×5): qty 1

## 2015-12-19 MED ORDER — COLLAGENASE 250 UNIT/GM EX OINT
TOPICAL_OINTMENT | Freq: Every day | CUTANEOUS | Status: DC
  Administered 2015-12-19 – 2015-12-22 (×4): via TOPICAL
  Administered 2015-12-23: 1 via TOPICAL
  Administered 2015-12-24 – 2015-12-28 (×5): via TOPICAL
  Filled 2015-12-19 (×2): qty 30

## 2015-12-19 MED ORDER — INTERFERON BETA-1B 0.3 MG ~~LOC~~ KIT: 0.3000 mg | PACK | SUBCUTANEOUS | Status: DC

## 2015-12-19 MED ORDER — PNEUMOCOCCAL VAC POLYVALENT 25 MCG/0.5ML IJ INJ
0.5000 mL | INJECTION | INTRAMUSCULAR | Status: AC
  Administered 2015-12-20: 0.5 mL via INTRAMUSCULAR
  Filled 2015-12-19: qty 1
  Filled 2015-12-19: qty 0.5

## 2015-12-19 MED ORDER — OXYCODONE HCL 5 MG PO TABS
5.0000 mg | ORAL_TABLET | Freq: Two times a day (BID) | ORAL | Status: DC
  Administered 2015-12-19 – 2015-12-28 (×14): 5 mg via ORAL
  Filled 2015-12-19 (×18): qty 1

## 2015-12-19 MED ORDER — INFLUENZA VAC SPLIT QUAD 0.5 ML IM SUSY
0.5000 mL | PREFILLED_SYRINGE | INTRAMUSCULAR | Status: AC
  Administered 2015-12-20: 0.5 mL via INTRAMUSCULAR
  Filled 2015-12-19: qty 0.5

## 2015-12-19 NOTE — Progress Notes (Signed)
PROGRESS NOTE  Sheena Porter QMV:[id number] DOB: Jul 08, 1951 DOA: 12/18/2015 PCP: Katy Apo, MD  Assessment/Plan: Acute respiratory failure with hypoxia due to Healthcare associated pneumonia. Chest x-ray as noted above. lactic acid within the limits of normal, maximum temperature 103.2, currently at 98.9, Oxygen saturation level 88 on room air, now on NRB 8 l. -Antibiotics per protocol with Aztreonam and Vanco -Blood cultures -Strep pneumo urine antigen -Legionella urine antigen -Oxygen supplementation and wean as able -Nebulizers with Xopenex every 6 hours prn Symbicort BID  Fecal impaction -enema and disimpaction  Sepsis of Unclear Etiology. In the setting of RLL HCAP. Rule out Urine source, pressure ulcer. Patient meets sepsis criteria based on vital sign changes lactic acidosis and hypoxia. Patient's acute respiratory failure likely secondary to RLL pneumonia which is Seen on cxr. UA pending, Initial Lactic acid 1.99. T max 103.2 CBC remarkable for leukocytosis, WBC 15. patient's ABG notable for pH 7.4 , PCO2 50, PO2 128, bicarbonate 30.8. EKG Sinus Tach, QTC 422. Will place patient on oxygen and titrate O2 amount and delivery system as needed. She is on 8 l NRB. Received 2.5 l NS to date Xopenex Follow blood and urine cultures Procalcitonin UA, cultures   Hypernatremia - likely due to dehydration, sodium 151, Cr 1.0  Continue hydration   Leukocytosis, likely related to underlying infection Cultures pending - abx as above - Repeat WBC in AM Continue Vanc/astreonam per Pharmacy   Pressure Ulcers Wound Care: Will add PT for hydrotherapy for the sacral wound to clean up a bit, may need NPWT VAC. Add LALM for pressure redistribution, most definitely needs low air loss mattress upon return to SNF. Will add enzymatic debridement ointment for the right posterior leg wound to clean yellow slough away. Right heel intact, stable eschar-best practice to leave  intact, will offload with Prevalon boots bilaterally since she is high risk.    Abnormal AST, in the setting of sepsis, dehydration. T bil 1.9 , AST 134 Continue hydration and repeat CMET in am.   Patient not able to tell me if she can ambulate at SNF or if she is bed bound Code Status: full Family Communication: patient Disposition Plan:    Consultants:  WOC  Procedures:     HPI/Subjective: C/o abd pain  Objective: Filed Vitals:   12/18/15 2118 12/19/15 0837  BP: 99/50 128/54  Pulse: 88 87  Temp: 98.2 F (36.8 C) 97.6 F (36.4 C)  Resp: 18 16    Intake/Output Summary (Last 24 hours) at 12/19/15 1539 Last data filed at 12/19/15 0900  Gross per 24 hour  Intake      0 ml  Output    200 ml  Net   -200 ml   Filed Weights   12/18/15 1156  Weight: 81.194 kg (179 lb)    Exam:   General:  Few words--   Cardiovascular: rrr  Respiratory: clear  Abdomen: diminished, tender to palpation  Musculoskeletal: no edema   Data Reviewed: Basic Metabolic Panel:  Recent Labs Lab 12/18/15 1158 12/18/15 1404 12/19/15 0637  NA 151* 152* 153*  K 4.0 3.8 4.5  CL 109 112* 120*  CO2 24 27 24   GLUCOSE 116* 119* 88  BUN 27* 24* 20  CREATININE 1.00 0.97 0.57  CALCIUM 8.7* 7.9* 7.7*  MG  --  2.0  --   PHOS  --  3.7  --    Liver Function Tests:  Recent Labs Lab 12/18/15 1158 12/18/15 1404 12/19/15 0637  AST 134* 115* 98*  ALT 35 31 27  ALKPHOS 87 72 65  BILITOT 1.9* 1.8* 1.1  PROT 6.9 5.8* 5.2*  ALBUMIN 2.3* 1.9* 1.8*   No results for input(s): LIPASE, AMYLASE in the last 168 hours. No results for input(s): AMMONIA in the last 168 hours. CBC:  Recent Labs Lab 12/18/15 1158 12/18/15 1404 12/19/15 0637  WBC 15.6* 14.1* 15.5*  NEUTROABS 12.6* 10.5*  --   HGB 12.8 11.2* 10.5*  HCT 40.9 36.3 34.0*  MCV 90.3 90.5 89.2  PLT 298 248 243   Cardiac Enzymes: No results for input(s): CKTOTAL, CKMB, CKMBINDEX, TROPONINI in the last 168 hours. BNP (last  3 results) No results for input(s): BNP in the last 8760 hours.  ProBNP (last 3 results) No results for input(s): PROBNP in the last 8760 hours.  CBG:  Recent Labs Lab 12/19/15 0739  GLUCAP 93    Recent Results (from the past 240 hour(s))  Blood Culture (routine x 2)     Status: None (Preliminary result)   Collection Time: 12/18/15 11:36 AM  Result Value Ref Range Status   Specimen Description BLOOD RIGHT ANTECUBITAL  Final   Special Requests BOTTLES DRAWN AEROBIC AND ANAEROBIC 10CC  Final   Culture NO GROWTH < 24 HOURS  Final   Report Status PENDING  Incomplete  Blood Culture (routine x 2)     Status: None (Preliminary result)   Collection Time: 12/18/15 11:37 AM  Result Value Ref Range Status   Specimen Description BLOOD LEFT ANTECUBITAL  Final   Special Requests BOTTLES DRAWN AEROBIC AND ANAEROBIC 5CC  Final   Culture NO GROWTH < 24 HOURS  Final   Report Status PENDING  Incomplete  Urine culture     Status: None   Collection Time: 12/18/15  1:35 PM  Result Value Ref Range Status   Specimen Description URINE, CATHETERIZED  Final   Special Requests NONE  Final   Culture 1,000 COLONIES/mL INSIGNIFICANT GROWTH  Final   Report Status 12/19/2015 FINAL  Final  MRSA PCR Screening     Status: None   Collection Time: 12/18/15  3:22 PM  Result Value Ref Range Status   MRSA by PCR NEGATIVE NEGATIVE Final    Comment:        The GeneXpert MRSA Assay (FDA approved for NASAL specimens only), is one component of a comprehensive MRSA colonization surveillance program. It is not intended to diagnose MRSA infection nor to guide or monitor treatment for MRSA infections.      Studies: Dg Chest Port 1 View  12/19/2015  CLINICAL DATA:  Sepsis EXAM: PORTABLE CHEST 1 VIEW COMPARISON:  Chest radiograph from one day prior. FINDINGS: Right rotated chest radiograph. Stable cardiomediastinal silhouette with normal heart size. No pneumothorax. No pleural effusion. Stable left lower lobe  consolidation. Mild patchy right upper parahilar opacities appear new. IMPRESSION: Stable left lower lobe consolidation. New mild patchy right upper parahilar opacities. Differential includes atelectasis and/or pneumonia. Electronically Signed   By: Delbert Phenix M.D.   On: 12/19/2015 07:50   Dg Chest Port 1 View  12/18/2015  CLINICAL DATA:  65 year old female with history of sepsis. EXAM: PORTABLE CHEST 1 VIEW COMPARISON:  No priors. FINDINGS: Left lower lobe airspace consolidation with air bronchograms. Linear scarring or subsegmental atelectasis in the right mid lung. No pleural effusions. No evidence of pulmonary edema. Heart size and mediastinal contours are within normal limits. IMPRESSION: 1. Right lower lobe consolidation and air bronchograms, concerning for pneumonia. Followup PA and lateral chest X-ray is  recommended in 3-4 weeks following trial of antibiotic therapy to ensure resolution and exclude underlying malignancy. Electronically Signed   By: Trudie Reed M.D.   On: 12/18/2015 12:02   Dg Abd Portable 1v  12/19/2015  CLINICAL DATA:  Abdominal pain. EXAM: PORTABLE ABDOMEN - 1 VIEW COMPARISON:  None. FINDINGS: No abnormal bowel dilatation is noted. Large amount of stool is noted in the rectum. Phleboliths are noted in the pelvis. IMPRESSION: Large amount of stool is noted in the rectum concerning for impaction. No abnormal bowel dilatation is noted. Electronically Signed   By: Lupita Raider, M.D.   On: 12/19/2015 13:38    Scheduled Meds: . aspirin EC  81 mg Oral Daily  . aztreonam  2 g Intravenous 3 times per day  . baclofen  10 mg Oral TID  . chlorhexidine  10 mL Mouth/Throat BID  . collagenase   Topical Daily  . divalproex  125 mg Oral Daily  . divalproex  500 mg Oral QHS  . enoxaparin (LOVENOX) injection  40 mg Subcutaneous Q24H  . feeding supplement  1 Container Oral TID BM  . [START ON 12/20/2015] fentaNYL  25 mcg Transdermal Q72H  . guaiFENesin  600 mg Oral BID  .  Interferon Beta-1b  0.3 mg Subcutaneous QODAY  . oxyCODONE  5 mg Oral BID  . saccharomyces boulardii  250 mg Oral Daily  . sodium phosphate  1 enema Rectal Once  . vancomycin  750 mg Intravenous Q12H   Continuous Infusions: . dextrose 5 % and 0.45% NaCl 75 mL/hr at 12/19/15 1145   Antibiotics Given (last 72 hours)    Date/Time Action Medication Dose Rate   12/18/15 2105 Given   aztreonam (AZACTAM) 2 g in dextrose 5 % 50 mL IVPB 2 g 100 mL/hr   12/19/15 0353 Given   vancomycin (VANCOCIN) IVPB 750 mg/150 ml premix 750 mg 150 mL/hr   12/19/15 5784 Given   aztreonam (AZACTAM) 2 g in dextrose 5 % 50 mL IVPB 2 g 100 mL/hr   12/19/15 1511 Given   aztreonam (AZACTAM) 2 g in dextrose 5 % 50 mL IVPB 2 g 100 mL/hr      Principal Problem:   Sepsis due to pneumonia [facility]) Active Problems:   HCAP (healthcare-associated pneumonia)   Multiple sclerosis (HCC)   Leukocytosis   Pressure ulcer    Time spent: 25 min    JESSICA U [facility]  [facility] Pager [phone]. If 7PM-7AM, please contact night-coverage at [email or web address], password [facility] 12/19/2015, 3:39 PM  LOS: 1 day

## 2015-12-19 NOTE — Consult Note (Signed)
WOC wound consult note Reason for Consult: pressure injury Patient from SNF, unable to obtain any history from patient is not communicative at this point. She will answer some questions appropriately.  Wound type: Stage 4 Pressure Injury; sacrum: 7cm x 10cm x 2cm with undermining at 3 o'clock that is 3cm  Unstageable Pressure Injury: left posterior leg: 1cm x 1cm x 0.1cm  Unstageable Pressure Injury: right heel lateral: 2.5cm x 1.5cm x 0 DTPI (deep tissue pressure injury): 0.5cm x 1.5cm x 0 Pressure Ulcer POA: Yes Measurement:see above Wound bed: Sacrum: 80% pink, 20% yellow/black Left posterior leg: 100% slough Left heel: 100% black eschar, hard, non fluctuant  Drainage (amount, consistency, odor) moderate to heavy, yellow/red, with some odor from the sacrum; None from the right heel and right leg wound.  Periwound: intact  Dressing procedure/placement/frequency: Will add PT for hydrotherapy for the sacral wound to clean up a bit, may need NPWT VAC.  Add LALM for pressure redistribution, most definitely needs low air loss mattress upon return to SNF.  Will add enzymatic debridement ointment for the right posterior leg wound to clean yellow slough away. Right heel intact, stable eschar-best practice to leave intact, will offload with Prevalon boots bilaterally since she is high risk.   Contacted PT department for hydro orders. Notified Licensed conveyancer to order LALM.  Bedside nurse to order Prevalon boots.  WOC will follow along with PT to evaluate wound for NPWT later in week or when deemed appropriate by PT.  Charletha Dalpe [place], [place] [phone]

## 2015-12-19 NOTE — Clinical Documentation Improvement (Signed)
Internal Medicine  Can the diagnosis of mobility status be further specified?   Function Quadriplegia, including suspected or known cause and/or associated condition(s)  Functional Quadriparesis, including suspected or known cause and/or associated condition(s)   Paraplegia  Other  Clinically Undetermined  Document any associated diagnoses/conditions. MS, from SNF, High fall risk  Supporting Information: ED: "worsening sacral wounds, right lower leg wounds//Decubitus ulcer noted on sacrum" H&P: "bedbound due to MS.   Neurologic: unable to move extremities due to MS.   Skin: Right heel ulcer. LLE with minimal skin breakdown. Known 15 cm presacral wound. Multiple areas of excoriation on both lower extremities  Foley cath Notes Per Nursing Flowsheet pt requires Maximove/ Total lift, and staff re-position              Please exercise your independent, professional judgment when responding. A specific answer is not anticipated or expected. Please update your documentation within the medical record to reflect your response to this query. Thank you  Thank Barrie Dunker Health Information Management [place] [phone]

## 2015-12-19 NOTE — Progress Notes (Signed)
Initial Nutrition Assessment  DOCUMENTATION CODES:   Obesity unspecified  INTERVENTION:  Provide Boost Breeze po TID, each supplement provides 250 kcal and 9 grams of protein.  Encourage adequate PO intake.   NUTRITION DIAGNOSIS:   Increased nutrient needs related to wound healing as evidenced by estimated needs.  GOAL:   Patient will meet greater than or equal to 90% of their needs  MONITOR:   PO intake, Supplement acceptance, Weight trends, Labs, I & O's, Skin, Diet advancement  REASON FOR ASSESSMENT:   Malnutrition Screening Tool    ASSESSMENT:   65 y.o. female with a h/o MS, anemia, brought today via EMS from [facility] rehabilitation due to fever and worsening sacral wounds, right lower leg wounds. Presents with sepsis due to PNA.  Pt reports 0% meal completion this AM. She reports eating fine however PTA with usual consumption of at least 2-3 meals daily. Usual body weight reported to be ~179 lbs. Pt is agreeable to nutritional supplements to aid in caloric and protein needs. RD to order. Pt was encouraged to eat her food at meals.   Pt with no observed significant fat or muscle mass loss.   Labs: Elevated sodium at 153.  Diet Order:  Diet clear liquid Room service appropriate?: Yes; Fluid consistency:: Thin  Skin:  Wound (see comment) (Stg 4 on sacurm, unstg ulcer on L heel, stg 2 on leg)  Last BM:  Unknown  Height:   Ht Readings from Last 1 Encounters:  12/18/15 5' (1.524 m)    Weight:   Wt Readings from Last 1 Encounters:  12/18/15 179 lb (81.194 kg)    Ideal Body Weight:  45.45 kg  BMI:  Body mass index is 34.96 kg/(m^2).  Estimated Nutritional Needs:   Kcal:  1900-2100  Protein:  95-110 grams  Fluid:  1.9- 2.1 L/day  EDUCATION NEEDS:   No education needs identified at this time  Roslyn Smiling, MS, RD, LDN Pager # [phone] After hours/ weekend pager # [phone]

## 2015-12-19 NOTE — Progress Notes (Signed)
Large bowel movement post enema given. Multiple wounds observed and documented by WOC.

## 2015-12-20 ENCOUNTER — Ambulatory Visit: Payer: Self-pay | Admitting: Podiatry

## 2015-12-20 DIAGNOSIS — A419 Sepsis, unspecified organism: Secondary | ICD-10-CM | POA: Diagnosis not present

## 2015-12-20 LAB — URINE CULTURE: CULTURE: NO GROWTH

## 2015-12-20 LAB — CBC
HCT: 32 % — ABNORMAL LOW (ref 36.0–46.0)
Hemoglobin: 9.4 g/dL — ABNORMAL LOW (ref 12.0–15.0)
MCH: 26.9 pg (ref 26.0–34.0)
MCHC: 29.4 g/dL — AB (ref 30.0–36.0)
MCV: 91.4 fL (ref 78.0–100.0)
PLATELETS: 269 10*3/uL (ref 150–400)
RBC: 3.5 MIL/uL — AB (ref 3.87–5.11)
RDW: 15.5 % (ref 11.5–15.5)
WBC: 8.1 10*3/uL (ref 4.0–10.5)

## 2015-12-20 LAB — GLUCOSE, CAPILLARY
GLUCOSE-CAPILLARY: 112 mg/dL — AB (ref 65–99)
Glucose-Capillary: 110 mg/dL — ABNORMAL HIGH (ref 65–99)
Glucose-Capillary: 96 mg/dL (ref 65–99)

## 2015-12-20 LAB — BASIC METABOLIC PANEL
Anion gap: 8 (ref 5–15)
BUN: 12 mg/dL (ref 6–20)
CO2: 27 mmol/L (ref 22–32)
CREATININE: 0.53 mg/dL (ref 0.44–1.00)
Calcium: 8.1 mg/dL — ABNORMAL LOW (ref 8.9–10.3)
Chloride: 120 mmol/L — ABNORMAL HIGH (ref 101–111)
Glucose, Bld: 94 mg/dL (ref 65–99)
Potassium: 3.2 mmol/L — ABNORMAL LOW (ref 3.5–5.1)
SODIUM: 155 mmol/L — AB (ref 135–145)

## 2015-12-20 LAB — PROCALCITONIN: PROCALCITONIN: 0.23 ng/mL

## 2015-12-20 LAB — VANCOMYCIN, TROUGH: Vancomycin Tr: 18 ug/mL (ref 10.0–20.0)

## 2015-12-20 MED ORDER — ARGININE PO POWD: 1.0000 | Freq: Two times a day (BID) | ORAL | Status: DC

## 2015-12-20 MED ORDER — POTASSIUM CHLORIDE 10 MEQ/100ML IV SOLN
10.0000 meq | INTRAVENOUS | Status: AC
  Administered 2015-12-20 (×3): 10 meq via INTRAVENOUS
  Filled 2015-12-20 (×3): qty 100

## 2015-12-20 MED ORDER — POTASSIUM CHLORIDE 10 MEQ/100ML IV SOLN
10.0000 meq | Freq: Once | INTRAVENOUS | Status: DC
  Filled 2015-12-20: qty 100

## 2015-12-20 MED ORDER — POTASSIUM CHLORIDE CRYS ER 20 MEQ PO TBCR
40.0000 meq | EXTENDED_RELEASE_TABLET | Freq: Once | ORAL | Status: DC
  Filled 2015-12-20: qty 2

## 2015-12-20 MED ORDER — PRO-STAT SUGAR FREE PO LIQD
30.0000 mL | Freq: Three times a day (TID) | ORAL | Status: DC
  Administered 2015-12-22: 30 mL via ORAL
  Filled 2015-12-20 (×6): qty 30

## 2015-12-20 MED ORDER — DOCUSATE SODIUM 100 MG PO CAPS
100.0000 mg | ORAL_CAPSULE | Freq: Two times a day (BID) | ORAL | Status: DC
  Administered 2015-12-20 – 2015-12-28 (×7): 100 mg via ORAL
  Filled 2015-12-20 (×15): qty 1

## 2015-12-20 NOTE — Progress Notes (Signed)
Physical Therapy Wound Eval and Treatment Patient Details  Name: Sheena Porter MRN: [id number] Date of Birth: 06-26-1951  Today's Date: 12/20/2015 Time: 8280-0349 Time Calculation (min): 41 min  Subjective  Subjective: Pt mostly non-verbal throughout session outside of moaning at times.  Patient and Family Stated Goals: Pt unable to participate in goal setting Date of Onset:  (Prior to admission)  Pain Score: Pt grimacing and moaning at times but unclear whether pt was in pain during treatment. When asked, pt denies pain.  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 (Active)  Dressing Type Barrier Film (skin prep);Foam;Gauze (Comment);Moist to dry 12/20/2015 10:37 AM  Dressing Clean;Dry;Intact 12/20/2015 10:37 AM  Dressing Change Frequency Other (Comment) 12/20/2015 10:37 AM  State of Healing Eschar 12/20/2015 10:37 AM  Site / Wound Assessment Brown;Pink;Yellow;Red 12/20/2015 10:37 AM  % Wound base Red or Granulating 50% 12/20/2015 10:37 AM  % Wound base Yellow 30% 12/20/2015 10:37 AM  % Wound base Black 20% 12/20/2015 10:37 AM  Peri-wound Assessment Intact 12/20/2015 10:37 AM  Wound Length (cm) 6.5 cm 12/20/2015 10:37 AM  Wound Width (cm) 10 cm 12/20/2015 10:37 AM  Wound Depth (cm) 2.5 cm 12/20/2015 10:37 AM  Undermining (cm) 2 12/20/2015 10:37 AM  Margins Unattached edges (unapproximated) 12/20/2015 10:37 AM  Drainage Amount Moderate 12/20/2015 10:37 AM  Drainage Description Serosanguineous 12/20/2015 10:37 AM  Treatment Debridement (Selective);Hydrotherapy (Pulse lavage);Packing (Saline gauze) 12/20/2015 10:37 AM                           Hydrotherapy Pulsed lavage therapy - wound location: Sacrum Pulsed Lavage with Suction (psi): 8 psi Pulsed Lavage with Suction - Normal Saline Used: 1000 mL Pulsed Lavage Tip: Tip with splash shield Selective Debridement Selective Debridement - Location: Sacrum Selective  Debridement - Tools Used: Forceps;Scissors Selective Debridement - Tissue Removed: brown and yellow necrotic tissue   Wound Assessment and Plan  Wound Therapy - Assess/Plan/Recommendations Wound Therapy - Clinical Statement: Presents to hydrotherapy with stage IV pressure injury. Tolerated hydrotherapy and selective debridement of non-viable tissues. Pt will benefit from continued treatment to promote healing of wound. Wound Therapy - Functional Problem List: Decreased mobility and OOB due to pressure injury. Factors Delaying/Impairing Wound Healing: Immobility;Multiple medical problems Hydrotherapy Plan: Debridement;Dressing change;Patient/family education;Pulsatile lavage with suction Wound Therapy - Frequency: 6X / week Wound Therapy - Follow Up Recommendations: Skilled nursing facility Wound Plan: See above  Wound Therapy Goals- Improve the function of patient's integumentary system by progressing the wound(s) through the phases of wound healing (inflammation - proliferation - remodeling) by: Decrease Necrotic Tissue to: 20% Decrease Necrotic Tissue - Progress: Goal set today Increase Granulation Tissue to: 80% Increase Granulation Tissue - Progress: Goal set today Goals/treatment plan/discharge plan were made with and agreed upon by patient/family: No, Patient unable to participate in goals/treatment/discharge plan and family unavailable Time For Goal Achievement: 7 days Wound Therapy - Potential for Goals: Good  Goals will be updated until maximal potential achieved or discharge criteria met.  Discharge criteria: when goals achieved, discharge from hospital, MD decision/surgical intervention, no progress towards goals, refusal/missing three consecutive treatments without notification or medical reason.  GP     Rolinda Roan 12/20/2015, 11:28 AM   Rolinda Roan, PT, DPT Acute Rehabilitation Services Pager: [phone]

## 2015-12-20 NOTE — Evaluation (Signed)
Clinical/Bedside Swallow Evaluation Patient Details  Name: Sheena Porter MRN: [id number] Date of Birth: 11/02/1950  Today's Date: 12/20/2015 Time: SLP Start Time (ACUTE ONLY): 1445 SLP Stop Time (ACUTE ONLY): 1530 SLP Time Calculation (min) (ACUTE ONLY): 45 min  Past Medical History:  Past Medical History  Diagnosis Date  . Multiple sclerosis (HCC)   . MDD (major depressive disorder) (HCC)   . Anemia    Past Surgical History: History reviewed. No pertinent past surgical history. HPI:  65 year old female admitted 12/18/15 due to significant decline in mental status intake and activity. Pt with acute repiratory failure, anemia, sacral and RLE wounds, and MS.    Assessment / Plan / Recommendation Clinical Impression  Very limited BSE, due to poor pt cooperation. Pt resistant to oral care, requiring significant verbal and tactile cues to allow SLP to clean mouth. Pt accepted several ice chips and 2 tiny boluses of puree. No overt s/s aspiration observed, however, amount of po accepted was minimal. Recommend continuing with current diet (full liquid) and ST will continue efforts to assess tolerance of additional consistencies.    Aspiration Risk  Mild aspiration risk    Diet Recommendation  (full liquid)   Liquid Administration via: Spoon Medication Administration: Crushed with puree Supervision: Full supervision/cueing for compensatory strategies Compensations: Minimize environmental distractions;Slow rate;Small sips/bites Postural Changes: Seated upright at 90 degrees;Remain upright for at least 30 minutes after po intake    Other  Recommendations Oral Care Recommendations: Oral care QID Other Recommendations: Have oral suction available   Follow up Recommendations       Frequency and Duration min 1 x/week  1 week       Prognosis Prognosis for Safe Diet Advancement: Fair Barriers to Reach Goals: Behavior Barriers/Prognosis Comment: Pt with limited cooperation       Swallow Study   General Date of Onset: 12/18/15 HPI: 65 year old female admitted 12/18/15 due to significant decline in mental status intake and activity. Pt with acute repiratory failure, anemia, sacral and RLE wounds, and MS.  Type of Study: Bedside Swallow Evaluation Previous Swallow Assessment: none found Diet Prior to this Study:  (full liquids) Temperature Spikes Noted: Yes Respiratory Status: Room air History of Recent Intubation: No Behavior/Cognition: Alert (resistant to participation) Oral Cavity Assessment: Dry (pt resistant to oral care) Oral Care Completed by SLP: Yes Oral Cavity - Dentition: Poor condition;Missing dentition Self-Feeding Abilities: Total assist Patient Positioning: Upright in bed Baseline Vocal Quality: Normal Volitional Cough:  (pt unable/unwilling to demonstrate) Volitional Swallow: Unable to elicit    Oral/Motor/Sensory Function Overall Oral Motor/Sensory Function: Within functional limits   Ice Chips Ice chips: Within functional limits Presentation: Spoon   Thin Liquid Thin Liquid: Not tested (pt refused trials of thin liquid via cup or straw)    Nectar Thick Nectar Thick Liquid: Not tested   Honey Thick Honey Thick Liquid: Not tested   Puree Puree: Within functional limits Presentation: Spoon   Solid   GO   Solid: Not tested        Leigh Aurora 12/20/2015,3:38 PM   Merlene Laughter B. Murvin [facility], CCC-SLP [phone]

## 2015-12-20 NOTE — Progress Notes (Signed)
Pharmacy Antibiotic Note  Sheena Porter is a 65 y.o. female admitted on 12/18/2015 with sepsis d/t unknown source.  Currently on D#3 of IV vancomycin and aztreonam. WBC has normalized. Tm 102.64F/24h. A vanc trough collected today was therapeutic at 18. Cultures remain negative so far.   PMH MS depression, anemia   Plan: Continue Vancomycin 750 mg IV Q 12 hours Aztreonam 2 g Q 8 hours Monitor CBC, renal fx, cultures and clinical progress   Height: 5' (152.4 cm) Weight: 179 lb (81.194 kg) IBW/kg (Calculated) : 45.5  Temp (24hrs), Avg:100.3 F (37.9 C), Min:98.5 F (36.9 C), Max:102.9 F (39.4 C)   Recent Labs Lab 12/18/15 1108 12/18/15 1158 12/18/15 1404 12/18/15 1655 12/19/15 0637 12/20/15 0512 12/20/15 1545  WBC  --  15.6* 14.1*  --  15.5* 8.1  --   CREATININE  --  1.00 0.97  --  0.57 0.53  --   LATICACIDVEN 1.99  --  1.4 1.7  --   --   --   VANCOTROUGH  --   --   --   --   --   --  18    Estimated Creatinine Clearance: 67.9 mL/min (by C-G formula based on Cr of 0.53).    Allergies  Allergen Reactions  . Penicillins Other (See Comments)  . Tape     Antimicrobials this admission: 3/12 vanc >>  3/12 aztreonam >>   Dose adjustments this admission: 3/14: VT 18 - on Vanc 750 mg IV Q 12 hours   Microbiology results: 3/12 BCx: NGTD 3/12 UCx:  Neg    Thank you for allowing pharmacy to be a part of this patient's care.  Vinnie Level, PharmD., BCPS Clinical Pharmacist Pager [phone]

## 2015-12-20 NOTE — Progress Notes (Addendum)
PROGRESS NOTE  Sheena Porter AVW:[id number] DOB: 05/14/1951 DOA: 12/18/2015 PCP: Katy Apo, MD  Assessment/Plan: Acute respiratory failure with hypoxia due to Healthcare associated pneumonia. Chest x-ray as noted above. lactic acid within the limits of normal, maximum temperature 103.2, currently at 98.9, Oxygen saturation level 88 on room air upon admission, improved -Antibiotics per protocol with Aztreonam and Vanco -Blood cultures- NGTD -urine culture insignificant growth -Oxygen supplementation and wean as able -Nebulizers with Xopenex every 6 hours prn Symbicort BID  Fecal impaction -enema and disimpaction  Sepsis of Unclear Etiology. In the setting of RLL HCAP. Rule out Urine source, pressure ulcer. Patient meets sepsis criteria based on vital sign changes lactic acidosis and hypoxia. Patient's acute respiratory failure likely secondary to RLL pneumonia which is Seen on cxr. UA pending, Initial Lactic acid 1.99. T max 103.2 CBC remarkable for leukocytosis, WBC 15. patient's ABG notable for pH 7.4 , PCO2 50, PO2 128, bicarbonate 30.8. EKG Sinus Tach, QTC 422.    Hypernatremia - likely due to dehydration, sodium 151, Cr 1.0  Continue hydration   Leukocytosis, likely related to underlying infection resolved  Hypokalemia -replete  Pressure Ulcers Wound Care: Will add PT for hydrotherapy for the sacral wound to clean up a bit, may need NPWT VAC. Add LALM for pressure redistribution, most definitely needs low air loss mattress upon return to SNF. Will add enzymatic debridement ointment for the right posterior leg wound to clean yellow slough away. Right heel intact, stable eschar-best practice to leave intact, will offload with Prevalon boots bilaterally since she is high risk.   Abnormal AST, in the setting of sepsis, dehydration. T bil 1.9 , AST 134 Continue hydration and repeat CMET in am.   Bed bound at SNF due to MS- functional quadriplegia  Code Status:  full Family Communication: patient--- family at beside Disposition Plan: back to SNF 2-3 days--- WILL NEED LOW AIR MATTRESS   Consultants:  WOC  Procedures:     HPI/Subjective: Feeling better More interactive  Objective: Filed Vitals:   12/20/15 0850 12/20/15 1000  BP: 116/51 119/61  Pulse: 90 82  Temp: 98.5 F (36.9 C)   Resp: 20 20    Intake/Output Summary (Last 24 hours) at 12/20/15 1227 Last data filed at 12/20/15 1000  Gross per 24 hour  Intake 1792.5 ml  Output    550 ml  Net 1242.5 ml   Filed Weights   12/18/15 1156  Weight: 81.194 kg (179 lb)    Exam:   General:  More interactive  Cardiovascular: rrr  Respiratory: clear  Abdomen: diminished, tender to palpation  Musculoskeletal: no edema   Data Reviewed: Basic Metabolic Panel:  Recent Labs Lab 12/18/15 1158 12/18/15 1404 12/19/15 0637 12/20/15 0512  NA 151* 152* 153* 155*  K 4.0 3.8 4.5 3.2*  CL 109 112* 120* 120*  CO2 24 27 24 27   GLUCOSE 116* 119* 88 94  BUN 27* 24* 20 12  CREATININE 1.00 0.97 0.57 0.53  CALCIUM 8.7* 7.9* 7.7* 8.1*  MG  --  2.0  --   --   PHOS  --  3.7  --   --    Liver Function Tests:  Recent Labs Lab 12/18/15 1158 12/18/15 1404 12/19/15 0637  AST 134* 115* 98*  ALT 35 31 27  ALKPHOS 87 72 65  BILITOT 1.9* 1.8* 1.1  PROT 6.9 5.8* 5.2*  ALBUMIN 2.3* 1.9* 1.8*   No results for input(s): LIPASE, AMYLASE in the last 168 hours. No results for  input(s): AMMONIA in the last 168 hours. CBC:  Recent Labs Lab 12/18/15 1158 12/18/15 1404 12/19/15 0637 12/20/15 0512  WBC 15.6* 14.1* 15.5* 8.1  NEUTROABS 12.6* 10.5*  --   --   HGB 12.8 11.2* 10.5* 9.4*  HCT 40.9 36.3 34.0* 32.0*  MCV 90.3 90.5 89.2 91.4  PLT 298 248 243 269   Cardiac Enzymes: No results for input(s): CKTOTAL, CKMB, CKMBINDEX, TROPONINI in the last 168 hours. BNP (last 3 results) No results for input(s): BNP in the last 8760 hours.  ProBNP (last 3 results) No results for  input(s): PROBNP in the last 8760 hours.  CBG:  Recent Labs Lab 12/19/15 0739 12/20/15 0815  GLUCAP 93 112*    Recent Results (from the past 240 hour(s))  Blood Culture (routine x 2)     Status: None (Preliminary result)   Collection Time: 12/18/15 11:36 AM  Result Value Ref Range Status   Specimen Description BLOOD RIGHT ANTECUBITAL  Final   Special Requests BOTTLES DRAWN AEROBIC AND ANAEROBIC 10CC  Final   Culture NO GROWTH 2 DAYS  Final   Report Status PENDING  Incomplete  Blood Culture (routine x 2)     Status: None (Preliminary result)   Collection Time: 12/18/15 11:37 AM  Result Value Ref Range Status   Specimen Description BLOOD LEFT ANTECUBITAL  Final   Special Requests BOTTLES DRAWN AEROBIC AND ANAEROBIC 5CC  Final   Culture NO GROWTH 2 DAYS  Final   Report Status PENDING  Incomplete  Urine culture     Status: None   Collection Time: 12/18/15  1:35 PM  Result Value Ref Range Status   Specimen Description URINE, CATHETERIZED  Final   Special Requests NONE  Final   Culture 1,000 COLONIES/mL INSIGNIFICANT GROWTH  Final   Report Status 12/19/2015 FINAL  Final  MRSA PCR Screening     Status: None   Collection Time: 12/18/15  3:22 PM  Result Value Ref Range Status   MRSA by PCR NEGATIVE NEGATIVE Final    Comment:        The GeneXpert MRSA Assay (FDA approved for NASAL specimens only), is one component of a comprehensive MRSA colonization surveillance program. It is not intended to diagnose MRSA infection nor to guide or monitor treatment for MRSA infections.      Studies: Dg Chest Port 1 View  12/19/2015  CLINICAL DATA:  Sepsis EXAM: PORTABLE CHEST 1 VIEW COMPARISON:  Chest radiograph from one day prior. FINDINGS: Right rotated chest radiograph. Stable cardiomediastinal silhouette with normal heart size. No pneumothorax. No pleural effusion. Stable left lower lobe consolidation. Mild patchy right upper parahilar opacities appear new. IMPRESSION: Stable left  lower lobe consolidation. New mild patchy right upper parahilar opacities. Differential includes atelectasis and/or pneumonia. Electronically Signed   By: Delbert Phenix M.D.   On: 12/19/2015 07:50   Dg Abd Portable 1v  12/19/2015  CLINICAL DATA:  Abdominal pain. EXAM: PORTABLE ABDOMEN - 1 VIEW COMPARISON:  None. FINDINGS: No abnormal bowel dilatation is noted. Large amount of stool is noted in the rectum. Phleboliths are noted in the pelvis. IMPRESSION: Large amount of stool is noted in the rectum concerning for impaction. No abnormal bowel dilatation is noted. Electronically Signed   By: Lupita Raider, M.D.   On: 12/19/2015 13:38    Scheduled Meds: . aspirin EC  81 mg Oral Daily  . aztreonam  2 g Intravenous 3 times per day  . baclofen  10 mg Oral TID  .  chlorhexidine  10 mL Mouth/Throat BID  . collagenase   Topical Daily  . divalproex  125 mg Oral Daily  . divalproex  500 mg Oral QHS  . enoxaparin (LOVENOX) injection  40 mg Subcutaneous Q24H  . feeding supplement  1 Container Oral TID BM  . feeding supplement (PRO-STAT SUGAR FREE 64)  30 mL Oral TID WC  . fentaNYL  25 mcg Transdermal Q72H  . guaiFENesin  600 mg Oral BID  . Influenza vac split quadrivalent PF  0.5 mL Intramuscular Tomorrow-1000  . oxyCODONE  5 mg Oral BID  . pneumococcal 23 valent vaccine  0.5 mL Intramuscular Tomorrow-1000  . saccharomyces boulardii  250 mg Oral Daily  . vancomycin  750 mg Intravenous Q12H   Continuous Infusions: . dextrose 5 % and 0.45% NaCl 100 mL/hr at 12/20/15 0951   Antibiotics Given (last 72 hours)    Date/Time Action Medication Dose Rate   12/18/15 2105 Given   aztreonam (AZACTAM) 2 g in dextrose 5 % 50 mL IVPB 2 g 100 mL/hr   12/19/15 0353 Given   vancomycin (VANCOCIN) IVPB 750 mg/150 ml premix 750 mg 150 mL/hr   12/19/15 0653 Given   aztreonam (AZACTAM) 2 g in dextrose 5 % 50 mL IVPB 2 g 100 mL/hr   12/19/15 1511 Given   aztreonam (AZACTAM) 2 g in dextrose 5 % 50 mL IVPB 2 g 100 mL/hr    12/19/15 1611 Given   vancomycin (VANCOCIN) IVPB 750 mg/150 ml premix 750 mg 150 mL/hr   12/19/15 2231 Given   aztreonam (AZACTAM) 2 g in dextrose 5 % 50 mL IVPB 2 g 100 mL/hr   12/20/15 0522 Given   aztreonam (AZACTAM) 2 g in dextrose 5 % 50 mL IVPB 2 g 100 mL/hr   12/20/15 0523 Given   vancomycin (VANCOCIN) IVPB 750 mg/150 ml premix 750 mg 150 mL/hr      Principal Problem:   Sepsis due to pneumonia (HCC) Active Problems:   HCAP (healthcare-associated pneumonia)   Multiple sclerosis (HCC)   Leukocytosis   Pressure ulcer    Time spent: 25 min    Kasai Beltran [facility]  [facility] Pager [phone]. If 7PM-7AM, please contact night-coverage at [email or web address], password [facility] 12/20/2015, 12:27 PM  LOS: 2 days

## 2015-12-20 NOTE — Progress Notes (Signed)
Utilization review completed. Anish Vana, RN, BSN. 

## 2015-12-21 ENCOUNTER — Inpatient Hospital Stay (HOSPITAL_COMMUNITY): Payer: Medicare Other

## 2015-12-21 LAB — CBC
HCT: 29.7 % — ABNORMAL LOW (ref 36.0–46.0)
HEMOGLOBIN: 9.3 g/dL — AB (ref 12.0–15.0)
MCH: 28.8 pg (ref 26.0–34.0)
MCHC: 31.3 g/dL (ref 30.0–36.0)
MCV: 92 fL (ref 78.0–100.0)
Platelets: 283 10*3/uL (ref 150–400)
RBC: 3.23 MIL/uL — AB (ref 3.87–5.11)
RDW: 15.5 % (ref 11.5–15.5)
WBC: 7.2 10*3/uL (ref 4.0–10.5)

## 2015-12-21 LAB — COMPREHENSIVE METABOLIC PANEL
ALBUMIN: 1.7 g/dL — AB (ref 3.5–5.0)
ALK PHOS: 61 U/L (ref 38–126)
ALT: 26 U/L (ref 14–54)
ANION GAP: 7 (ref 5–15)
AST: 92 U/L — AB (ref 15–41)
BUN: 10 mg/dL (ref 6–20)
CALCIUM: 7.8 mg/dL — AB (ref 8.9–10.3)
CO2: 25 mmol/L (ref 22–32)
Chloride: 116 mmol/L — ABNORMAL HIGH (ref 101–111)
Creatinine, Ser: 0.67 mg/dL (ref 0.44–1.00)
Glucose, Bld: 116 mg/dL — ABNORMAL HIGH (ref 65–99)
Potassium: 3.5 mmol/L (ref 3.5–5.1)
SODIUM: 148 mmol/L — AB (ref 135–145)
TOTAL PROTEIN: 5.3 g/dL — AB (ref 6.5–8.1)
Total Bilirubin: 0.6 mg/dL (ref 0.3–1.2)

## 2015-12-21 LAB — CORTISOL-AM, BLOOD: Cortisol - AM: 10.5 ug/dL (ref 6.7–22.6)

## 2015-12-21 LAB — INFLUENZA PANEL BY PCR (TYPE A & B)
H1N1FLUPCR: NOT DETECTED
INFLAPCR: POSITIVE — AB
Influenza B By PCR: NEGATIVE

## 2015-12-21 LAB — GLUCOSE, CAPILLARY: Glucose-Capillary: 119 mg/dL — ABNORMAL HIGH (ref 65–99)

## 2015-12-21 LAB — LACTIC ACID, PLASMA: LACTIC ACID, VENOUS: 2 mmol/L (ref 0.5–2.0)

## 2015-12-21 MED ORDER — OSELTAMIVIR PHOSPHATE 75 MG PO CAPS
75.0000 mg | ORAL_CAPSULE | Freq: Two times a day (BID) | ORAL | Status: AC
  Administered 2015-12-21 – 2015-12-25 (×7): 75 mg via ORAL
  Filled 2015-12-21 (×14): qty 1

## 2015-12-21 MED ORDER — SODIUM CHLORIDE 0.45 % IV BOLUS
500.0000 mL | Freq: Once | INTRAVENOUS | Status: AC
  Administered 2015-12-21: 500 mL via INTRAVENOUS

## 2015-12-21 MED ORDER — SODIUM CHLORIDE 0.9 % IV SOLN
1.0000 g | Freq: Three times a day (TID) | INTRAVENOUS | Status: DC
  Administered 2015-12-21 – 2015-12-26 (×16): 1 g via INTRAVENOUS
  Filled 2015-12-21 (×17): qty 1

## 2015-12-21 MED ORDER — SODIUM CHLORIDE 0.9 % IV BOLUS (SEPSIS)
1000.0000 mL | Freq: Once | INTRAVENOUS | Status: AC
  Administered 2015-12-21: 1000 mL via INTRAVENOUS

## 2015-12-21 NOTE — Progress Notes (Signed)
Pharmacy Antibiotic Note  Sheena Porter is a 65 y.o. female admitted on 12/18/2015 with sepsis due to an unknown source. The patient was on Doxy/Erta PTA at the NH and has been on Vancomycin + Azactam since admission. Given the patient's persistent fevers and recent hypotension - pharmacy has been consulted to broaden Azactam to Meropenem.   The patient is noted to be on depakote as a mood stabilizer. It is known that carbapenems can decrease VPA concentrations however since only on for mood instead of seizures - likely okay in the short term. Will monitor for any necessary changes.   An influenza panel was sent for testing on 3/15.   Plan: 1. Start Meropenem 1g IV every 8 hours 2. Continue Vancomycin 750 mg IV every 12 hours 3. Will continue to follow renal function, culture results, LOT, and antibiotic de-escalation plans   Height: 5' (152.4 cm) Weight: 179 lb (81.194 kg) IBW/kg (Calculated) : 45.5  Temp (24hrs), Avg:100.4 F (38 C), Min:99 F (37.2 C), Max:102.2 F (39 C)   Recent Labs Lab 12/18/15 1108 12/18/15 1158 12/18/15 1404 12/18/15 1655 12/19/15 0637 12/20/15 0512 12/20/15 1545 12/21/15 0521  WBC  --  15.6* 14.1*  --  15.5* 8.1  --  7.2  CREATININE  --  1.00 0.97  --  0.57 0.53  --  0.67  LATICACIDVEN 1.99  --  1.4 1.7  --   --   --   --   VANCOTROUGH  --   --   --   --   --   --  18  --     Estimated Creatinine Clearance: 67.9 mL/min (by C-G formula based on Cr of 0.67).    Allergies  Allergen Reactions  . Penicillins Other (See Comments)  . Tape     Antimicrobials this admission: Doxy/Erta PTA (leg/sacral wound) 3/6 >> (intended to go for 10d) Vanc 3/12 >> Azactam 3/12 >> 3/15 Meropenem 3/15 >>  Dose adjustments this admission: * 3/14 VT 18 mcg/ml on 750 mg/12h >> cont current dose  Microbiology results: 3/12 BCx >> ngtd 3/12 UCx >> NG 3/12 MRSA >> negative  Thank you for allowing pharmacy to be a part of this patient's care.  Georgina Pillion, PharmD, BCPS Clinical Pharmacist Pager: [phone] 12/21/2015 10:11 AM

## 2015-12-21 NOTE — Progress Notes (Signed)
Speech Language Pathology Treatment: Dysphagia  Patient Details Name: Sheena Porter MRN: [id number] DOB: [birth year] Today's Date: 12/21/2015 Time: 4098-1191 SLP Time Calculation (min) (ACUTE ONLY): 10 min  Assessment / Plan / Recommendation Clinical Impression  Pt is still resistant to PO intake, although agreeable to small amounts of ginger ale, consumed via large straw sips. No overt s/s of aspiration observed, but possible subtle signs include watery eyes. Brief oral holding was noted, followed by clear vocal quality post-swallow. She appears to be tolerating current diet, but assessment remains limited. Will continue to follow to determine safe swallowing with solids pending improved intake.   HPI HPI: 65 year old female admitted 12/18/15 due to significant decline in mental status intake and activity. Pt with acute repiratory failure, anemia, sacral and RLE wounds, and MS.       SLP Plan  Continue with current plan of care     Recommendations  Diet recommendations: Thin liquid Liquids provided via: Cup;Straw Medication Administration: Crushed with puree Supervision: Patient able to self feed;Full supervision/cueing for compensatory strategies Compensations: Minimize environmental distractions;Slow rate;Small sips/bites Postural Changes and/or Swallow Maneuvers: Seated upright 90 degrees;Upright 30-60 min after meal             Oral Care Recommendations: Oral care QID Follow up Recommendations:  (tba) Plan: Continue with current plan of care     GO               Sheena Porter, M.A. CCC-SLP [phone]  Sheena Porter 12/21/2015, 3:17 PM

## 2015-12-21 NOTE — Progress Notes (Signed)
Pt. Temp was 102.2 and tylenol was given. Temp. Rechecked now 101.4. Pt. BP is 88/32. NP on call K. Craige Cotta notified. Will continue to monitor.

## 2015-12-21 NOTE — Progress Notes (Addendum)
PROGRESS NOTE  Sheena Porter ZOX:[id number] DOB: November 17, 1950 DOA: 12/18/2015 PCP: Katy Apo, MD  Sheena Porter is a 65 y.o. female with a h/o MS, anemia, brought today via EMS from [facility] rehabilitation due to fever and worsening sacral wounds, right lower leg wounds. The patient was receiving doxycycline 100 mg twice a day for 6 days and Invanz 1 g IV IM x 4 doses prior to presentation. Family reported that for the last 2 days, she had significant decline of her mental status, and oral intake and activity. They are not aware of any sick contacts. She is bedbound due to MS.  Palliative care consult   Assessment/Plan: Acute respiratory failure with hypoxia due to Healthcare associated pneumonia vs decubitus ulcer -Antibiotics vanc/merrem -Blood cultures- NGTD -urine culture insignificant growth -Oxygen supplementation and wean as able -Nebulizers with Xopenex every 6 hours prn Symbicort BID R/o flu  Fecal impaction -enema and disimpaction -bowel regimen  Sepsis of Unclear Etiology. In the setting of RLL HCAP. Rule out Urine source, pressure ulcer. Patient meets sepsis criteria based on vital sign changes lactic acidosis and hypoxia. Patient's acute respiratory failure likely secondary to RLL pneumonia which is Seen on cxr. UA pending, Initial Lactic acid 1.99. T max 103.2 CBC remarkable for leukocytosis, WBC 15. patient's ABG notable for pH 7.4 , PCO2 50, PO2 128, bicarbonate 30.8. EKG Sinus Tach, QTC 422.  -check cortisol and lactic acid   Hypernatremia - likely due to dehydration Continue hydration  -encourage PO intake  Leukocytosis, likely related to underlying infection resolved  Hypokalemia -replete  Pressure Ulcers Wound Care:  PT for hydrotherapy for the sacral wound to clean up a bit, may need NPWT VAC. Add LALM for pressure redistribution, most definitely needs low air loss mattress upon return to SNF. Will add enzymatic debridement ointment for  the right posterior leg wound to clean yellow slough away. Right heel intact, stable eschar-best practice to leave intact, will offload with Prevalon boots bilaterally since she is high risk.   Abnormal AST, in the setting of sepsis, dehydration. T bil 1.9 , AST 134 Continue hydration and repeat CMET in am.   H/o MS -unable to give injection here as we do not stock in pharmacy  Palliative care consult -not ambulatory, not eating well -my worry is wound will not heal  Bed bound at SNF due to MS- functional quadriplegia  Code Status: full Family Communication: patient--- family at beside Disposition Plan: SNF when stable= WILL NEED LOW AIR MATTRESS ordered at d/c   Consultants:  WOC  Procedures:     HPI/Subjective: Temperature last PM Sleepy this AM but will awaken  Objective: Filed Vitals:   12/20/15 2000 12/21/15 0543  BP: 120/49 88/32  Pulse: 90 91  Temp: 100.5 F (38.1 C) 102.2 F (39 C)  Resp: 24 21    Intake/Output Summary (Last 24 hours) at 12/21/15 0933 Last data filed at 12/21/15 0500  Gross per 24 hour  Intake 2772.5 ml  Output    250 ml  Net 2522.5 ml   Filed Weights   12/18/15 1156  Weight: 81.194 kg (179 lb)    Exam:   General:  Sleepy today  Cardiovascular: rrr  Respiratory: clear  Abdomen: +BS, softer, not distended  Musculoskeletal: wearing prevlon boots  Data Reviewed: Basic Metabolic Panel:  Recent Labs Lab 12/18/15 1158 12/18/15 1404 12/19/15 0637 12/20/15 0512 12/21/15 0521  NA 151* 152* 153* 155* 148*  K 4.0 3.8 4.5 3.2* 3.5  CL 109 112*  120* 120* 116*  CO2 24 27 24 27 25   GLUCOSE 116* 119* 88 94 116*  BUN 27* 24* 20 12 10   CREATININE 1.00 0.97 0.57 0.53 0.67  CALCIUM 8.7* 7.9* 7.7* 8.1* 7.8*  MG  --  2.0  --   --   --   PHOS  --  3.7  --   --   --    Liver Function Tests:  Recent Labs Lab 12/18/15 1158 12/18/15 1404 12/19/15 0637 12/21/15 0521  AST 134* 115* 98* 92*  ALT 35 31 27 26   ALKPHOS 87 72  65 61  BILITOT 1.9* 1.8* 1.1 0.6  PROT 6.9 5.8* 5.2* 5.3*  ALBUMIN 2.3* 1.9* 1.8* 1.7*   No results for input(s): LIPASE, AMYLASE in the last 168 hours. No results for input(s): AMMONIA in the last 168 hours. CBC:  Recent Labs Lab 12/18/15 1158 12/18/15 1404 12/19/15 0637 12/20/15 0512 12/21/15 0521  WBC 15.6* 14.1* 15.5* 8.1 7.2  NEUTROABS 12.6* 10.5*  --   --   --   HGB 12.8 11.2* 10.5* 9.4* 9.3*  HCT 40.9 36.3 34.0* 32.0* 29.7*  MCV 90.3 90.5 89.2 91.4 92.0  PLT 298 248 243 269 283   Cardiac Enzymes: No results for input(s): CKTOTAL, CKMB, CKMBINDEX, TROPONINI in the last 168 hours. BNP (last 3 results) No results for input(s): BNP in the last 8760 hours.  ProBNP (last 3 results) No results for input(s): PROBNP in the last 8760 hours.  CBG:  Recent Labs Lab 12/19/15 0739 12/20/15 0815 12/20/15 1240 12/20/15 1729 12/21/15 0741  GLUCAP 93 112* 110* 96 119*    Recent Results (from the past 240 hour(s))  Blood Culture (routine x 2)     Status: None (Preliminary result)   Collection Time: 12/18/15 11:36 AM  Result Value Ref Range Status   Specimen Description BLOOD RIGHT ANTECUBITAL  Final   Special Requests BOTTLES DRAWN AEROBIC AND ANAEROBIC 10CC  Final   Culture NO GROWTH 2 DAYS  Final   Report Status PENDING  Incomplete  Blood Culture (routine x 2)     Status: None (Preliminary result)   Collection Time: 12/18/15 11:37 AM  Result Value Ref Range Status   Specimen Description BLOOD LEFT ANTECUBITAL  Final   Special Requests BOTTLES DRAWN AEROBIC AND ANAEROBIC 5CC  Final   Culture NO GROWTH 2 DAYS  Final   Report Status PENDING  Incomplete  Urine culture     Status: None   Collection Time: 12/18/15  1:35 PM  Result Value Ref Range Status   Specimen Description URINE, CATHETERIZED  Final   Special Requests NONE  Final   Culture 1,000 COLONIES/mL INSIGNIFICANT GROWTH  Final   Report Status 12/19/2015 FINAL  Final  MRSA PCR Screening     Status: None    Collection Time: 12/18/15  3:22 PM  Result Value Ref Range Status   MRSA by PCR NEGATIVE NEGATIVE Final    Comment:        The GeneXpert MRSA Assay (FDA approved for NASAL specimens only), is one component of a comprehensive MRSA colonization surveillance program. It is not intended to diagnose MRSA infection nor to guide or monitor treatment for MRSA infections.   Urine culture     Status: None   Collection Time: 12/19/15  4:41 AM  Result Value Ref Range Status   Specimen Description URINE, CATHETERIZED  Final   Special Requests NONE  Final   Culture NO GROWTH 1 DAY  Final  Report Status 12/20/2015 FINAL  Final     Studies: Dg Abd Portable 1v  12/19/2015  CLINICAL DATA:  Abdominal pain. EXAM: PORTABLE ABDOMEN - 1 VIEW COMPARISON:  None. FINDINGS: No abnormal bowel dilatation is noted. Large amount of stool is noted in the rectum. Phleboliths are noted in the pelvis. IMPRESSION: Large amount of stool is noted in the rectum concerning for impaction. No abnormal bowel dilatation is noted. Electronically Signed   By: Lupita Raider, M.D.   On: 12/19/2015 13:38    Scheduled Meds: . aspirin EC  81 mg Oral Daily  . aztreonam  2 g Intravenous 3 times per day  . baclofen  10 mg Oral TID  . chlorhexidine  10 mL Mouth/Throat BID  . collagenase   Topical Daily  . divalproex  125 mg Oral Daily  . divalproex  500 mg Oral QHS  . docusate sodium  100 mg Oral BID  . enoxaparin (LOVENOX) injection  40 mg Subcutaneous Q24H  . feeding supplement  1 Container Oral TID BM  . feeding supplement (PRO-STAT SUGAR FREE 64)  30 mL Oral TID WC  . fentaNYL  25 mcg Transdermal Q72H  . guaiFENesin  600 mg Oral BID  . oxyCODONE  5 mg Oral BID  . potassium chloride  10 mEq Intravenous Once  . saccharomyces boulardii  250 mg Oral Daily  . vancomycin  750 mg Intravenous Q12H   Continuous Infusions: . dextrose 5 % and 0.45% NaCl 100 mL/hr at 12/21/15 0435   Antibiotics Given (last 72 hours)     Date/Time Action Medication Dose Rate   12/18/15 2105 Given   aztreonam (AZACTAM) 2 g in dextrose 5 % 50 mL IVPB 2 g 100 mL/hr   12/19/15 0353 Given   vancomycin (VANCOCIN) IVPB 750 mg/150 ml premix 750 mg 150 mL/hr   12/19/15 0653 Given   aztreonam (AZACTAM) 2 g in dextrose 5 % 50 mL IVPB 2 g 100 mL/hr   12/19/15 1511 Given   aztreonam (AZACTAM) 2 g in dextrose 5 % 50 mL IVPB 2 g 100 mL/hr   12/19/15 1611 Given   vancomycin (VANCOCIN) IVPB 750 mg/150 ml premix 750 mg 150 mL/hr   12/19/15 2231 Given   aztreonam (AZACTAM) 2 g in dextrose 5 % 50 mL IVPB 2 g 100 mL/hr   12/20/15 0522 Given   aztreonam (AZACTAM) 2 g in dextrose 5 % 50 mL IVPB 2 g 100 mL/hr   12/20/15 0523 Given   vancomycin (VANCOCIN) IVPB 750 mg/150 ml premix 750 mg 150 mL/hr   12/20/15 1537 Given   aztreonam (AZACTAM) 2 g in dextrose 5 % 50 mL IVPB 2 g 100 mL/hr   12/20/15 1724 Given   vancomycin (VANCOCIN) IVPB 750 mg/150 ml premix 750 mg 150 mL/hr   12/20/15 2129 Given   aztreonam (AZACTAM) 2 g in dextrose 5 % 50 mL IVPB 2 g 100 mL/hr   12/21/15 0435 Given   vancomycin (VANCOCIN) IVPB 750 mg/150 ml premix 750 mg 150 mL/hr   12/21/15 0546 Given   aztreonam (AZACTAM) 2 g in dextrose 5 % 50 mL IVPB 2 g 100 mL/hr      Principal Problem:   Sepsis due to pneumonia [facility]) Active Problems:   HCAP (healthcare-associated pneumonia)   Multiple sclerosis (HCC)   Leukocytosis   Pressure ulcer    Time spent: 25 min    Nakaya Mishkin [facility]  [facility] Pager [phone]. If 7PM-7AM, please contact night-coverage at [email or web address],  password [facility] 12/21/2015, 9:33 AM  LOS: 3 days

## 2015-12-21 NOTE — Progress Notes (Signed)
Physical Therapy Wound Treatment Patient Details  Name: Sheena Porter MRN: [id number] Date of Birth: 07/18/1951  Today's Date: 12/21/2015 Time: 0865-7846 Time Calculation (min): 37 min  Subjective  Subjective: Pt mostly non-verbal throughout session however did say "goodbye" as therapist was leaving.  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 (Active)  Dressing Type Barrier Film (skin prep);Gauze (Comment);Moist to dry 12/21/2015  2:54 PM  Dressing Clean;Dry;Intact 12/21/2015  2:54 PM  Dressing Change Frequency Other (Comment) 12/21/2015  2:54 PM  State of Healing Eschar 12/21/2015  2:54 PM  Site / Wound Assessment Brown;Pink;Yellow;Red 12/21/2015  2:54 PM  % Wound base Red or Granulating 55% 12/21/2015  2:54 PM  % Wound base Yellow 30% 12/21/2015  2:54 PM  % Wound base Black 15% 12/21/2015  2:54 PM  Peri-wound Assessment Intact 12/21/2015  2:54 PM  Wound Length (cm) 6.5 cm 12/20/2015 10:37 AM  Wound Width (cm) 10 cm 12/20/2015 10:37 AM  Wound Depth (cm) 2.5 cm 12/20/2015 10:37 AM  Undermining (cm) 2 12/20/2015 10:37 AM  Margins Unattached edges (unapproximated) 12/21/2015  2:54 PM  Drainage Amount Moderate 12/21/2015  2:54 PM  Drainage Description Serosanguineous 12/21/2015  2:54 PM  Treatment Debridement (Selective);Hydrotherapy (Pulse lavage);Packing (Saline gauze) 12/21/2015  2:54 PM       Hydrotherapy Pulsed lavage therapy - wound location: Sacrum Pulsed Lavage with Suction (psi): 8 psi Pulsed Lavage with Suction - Normal Saline Used: 1000 mL Pulsed Lavage Tip: Tip with splash shield Selective Debridement Selective Debridement - Location: Sacrum Selective Debridement - Tools Used: Forceps;Scissors Selective  Debridement - Tissue Removed: brown and yellow necrotic tissue   Wound Assessment and Plan  Wound Therapy - Assess/Plan/Recommendations Wound Therapy - Clinical Statement: Pt tolerated hydrotherapy and selective debridement of non-viable tissues. Wound Therapy - Functional Problem List: Decreased mobility and OOB due to pressure injury. Factors Delaying/Impairing Wound Healing: Immobility;Multiple medical problems Hydrotherapy Plan: Debridement;Dressing change;Patient/family education;Pulsatile lavage with suction Wound Therapy - Frequency: 6X / week Wound Therapy - Follow Up Recommendations: Skilled nursing facility Wound Plan: See above  Wound Therapy Goals- Improve the function of patient's integumentary system by progressing the wound(s) through the phases of wound healing (inflammation - proliferation - remodeling) by: Decrease Necrotic Tissue to: 20% Decrease Necrotic Tissue - Progress: Progressing toward goal Increase Granulation Tissue to: 80% Increase Granulation Tissue - Progress: Progressing toward goal Goals/treatment plan/discharge plan were made with and agreed upon by patient/family: No, Patient unable to participate in goals/treatment/discharge plan and family unavailable Time For Goal Achievement: 7 days Wound Therapy - Potential for Goals: Good  Goals will be updated until maximal potential achieved or discharge criteria met.  Discharge criteria: when goals achieved, discharge from hospital, MD decision/surgical intervention, no progress towards goals, refusal/missing three consecutive treatments without notification or medical reason.  GP     Rolinda Roan 12/21/2015, 3:05 PM   Rolinda Roan, PT, DPT Acute Rehabilitation Services Pager: [phone]

## 2015-12-21 NOTE — Care Management Important Message (Signed)
Important Message  Patient Details  Name: Sheena Porter MRN: [id number] Date of Birth: 1950/12/12   Medicare Important Message Given:  Yes    Mikle Sternberg, Annamarie Major, RN 12/21/2015, 4:03 PM

## 2015-12-22 DIAGNOSIS — Z515 Encounter for palliative care: Secondary | ICD-10-CM

## 2015-12-22 LAB — BASIC METABOLIC PANEL
ANION GAP: 9 (ref 5–15)
BUN: 7 mg/dL (ref 6–20)
CHLORIDE: 114 mmol/L — AB (ref 101–111)
CO2: 22 mmol/L (ref 22–32)
Calcium: 7.9 mg/dL — ABNORMAL LOW (ref 8.9–10.3)
Creatinine, Ser: 0.51 mg/dL (ref 0.44–1.00)
Glucose, Bld: 98 mg/dL (ref 65–99)
POTASSIUM: 3.1 mmol/L — AB (ref 3.5–5.1)
SODIUM: 145 mmol/L (ref 135–145)

## 2015-12-22 LAB — CBC
HCT: 28.2 % — ABNORMAL LOW (ref 36.0–46.0)
HEMOGLOBIN: 9.1 g/dL — AB (ref 12.0–15.0)
MCH: 29.1 pg (ref 26.0–34.0)
MCHC: 32.3 g/dL (ref 30.0–36.0)
MCV: 90.1 fL (ref 78.0–100.0)
PLATELETS: 307 10*3/uL (ref 150–400)
RBC: 3.13 MIL/uL — AB (ref 3.87–5.11)
RDW: 15.6 % — ABNORMAL HIGH (ref 11.5–15.5)
WBC: 9.2 10*3/uL (ref 4.0–10.5)

## 2015-12-22 LAB — GLUCOSE, CAPILLARY: GLUCOSE-CAPILLARY: 95 mg/dL (ref 65–99)

## 2015-12-22 MED ORDER — POTASSIUM CHLORIDE CRYS ER 20 MEQ PO TBCR
40.0000 meq | EXTENDED_RELEASE_TABLET | Freq: Two times a day (BID) | ORAL | Status: AC
  Administered 2015-12-22 (×2): 40 meq via ORAL
  Filled 2015-12-22 (×2): qty 2

## 2015-12-22 NOTE — Consult Note (Signed)
Consultation Note Date: 12/22/2015   Patient Name: Sheena Porter  DOB: 04-Apr-1951  MRN: [id number]  Age / Sex: 65 y.o., female  PCP: Renford Dills, MD Referring Physician: Kathlen Mody, MD  Reason for Consultation: Establishing goals of care and Psychosocial/spiritual support    Clinical Assessment/Narrative:   65 y.o. female with a h/o MS, anemia, brought today via EMS from [facility]  rehabilitation due to fever and worsening sacral wounds, right lower leg wounds. The patient was receiving doxycycline 100 mg twice a day for 6 days and Invanz 1 g IV IM x 4 doses prior to presentation.  Patient has lived in a SNF for approximately 8 yrs  Family reported that for the last 2 days, she had significant decline of her mental status, and oral intake and activity.  She is bedbound due to MS.Her brother tells me she has had a significant emotional decline since her room-mate left three weeks ago.  Continued physical, functional and cognitive decline over the past many years 2/2 to he MS.  There is no HPOA but her brother and son ( also has MS) work together Government social research officer for the patients.  Family face advanced directive decisions and anticipatory care needs.   Brother and niece are here today.  This NP Lorinda Creed reviewed medical records, received report from team, assessed the patient and then meet at the patient's bedside along with her brother Casimiro Needle and a neice  to discuss diagnosis, prognosis, GOC, EOL wishes disposition and options.  A detailed discussion was had today regarding advanced directives.  Concepts specific to code status, artifical feeding and hydration, continued IV antibiotics and rehospitalization was had.  The difference between a aggressive medical intervention path  and a palliative comfort care path for this patient at this time was had.  Values and goals of care important to patient and family  were attempted to be elicited.  Concept of Hospice and Palliative Care were discussed.   MOST form was introduced    Questions and concerns addressed.  Hard Choices booklet left for review. Family encouraged to call with questions or concerns.  PMT will continue to support holistically.   HCPOA: none   SUMMARY OF RECOMMENDATIONS  - treat the treatable, when stable back to SNF ( will benefit from Palliative Services in the SNF for continued GOC discussion and clarification)    Code Status/Advance Care Planning:  Full code - family encouraged to consider DNR status knowing poor outcomes in similar patietns     Code Status Orders        Start     Ordered   12/18/15 1351  Full code   Continuous     12/18/15 1401    Code Status History    Date Active Date Inactive Code Status Order ID Comments User Context   This patient has a current code status but no historical code status.      Other Directives:None  Symptom Management:   Depression/Adjustmetn Disorder: Celexa  10 mg po daily  Palliative Prophylaxis:   Aspiration, Bowel Regimen, Delirium Protocol, Frequent Pain Assessment, Oral Care and Turn Reposition   Psycho-social/Spiritual:  Support System: Fair  Additional Recommendations: Education on Hospice  Prognosis: Unable to determine  Discharge Planning: Skilled Nursing Facility for rehab with Palliative care service follow-up   Chief Complaint/ Primary Diagnoses: Present on Admission:  . HCAP (healthcare-associated pneumonia) . Sepsis due to pneumonia (HCC) . Multiple sclerosis (HCC) . Leukocytosis  I have reviewed the medical record, interviewed the  patient and family, and examined the patient. The following aspects are pertinent.  Past Medical History  Diagnosis Date  . Multiple sclerosis (HCC)   . MDD (major depressive disorder) (HCC)   . Anemia    Social History   Social History  . Marital Status: Divorced    Spouse Name: N/A  . Number of  Children: N/A  . Years of Education: N/A   Social History Main Topics  . Smoking status: Never Smoker   . Smokeless tobacco: None  . Alcohol Use: No  . Drug Use: None  . Sexual Activity: Not Asked   Other Topics Concern  . None   Social History Narrative  . None   No family history on file. Scheduled Meds: . aspirin EC  81 mg Oral Daily  . baclofen  10 mg Oral TID  . chlorhexidine  10 mL Mouth/Throat BID  . collagenase   Topical Daily  . divalproex  125 mg Oral Daily  . divalproex  500 mg Oral QHS  . docusate sodium  100 mg Oral BID  . enoxaparin (LOVENOX) injection  40 mg Subcutaneous Q24H  . feeding supplement  1 Container Oral TID BM  . feeding supplement (PRO-STAT SUGAR FREE 64)  30 mL Oral TID WC  . fentaNYL  25 mcg Transdermal Q72H  . guaiFENesin  600 mg Oral BID  . meropenem (MERREM) IV  1 g Intravenous Q8H  . oseltamivir  75 mg Oral BID  . oxyCODONE  5 mg Oral BID  . potassium chloride  10 mEq Intravenous Once  . potassium chloride  40 mEq Oral BID  . saccharomyces boulardii  250 mg Oral Daily  . vancomycin  750 mg Intravenous Q12H   Continuous Infusions: . dextrose 5 % and 0.45% NaCl 100 mL/hr at 12/21/15 0435   PRN Meds:.acetaminophen **OR** acetaminophen, HYDROcodone-acetaminophen, levalbuterol, ondansetron **OR** ondansetron (ZOFRAN) IV, oxyCODONE, senna-docusate Medications Prior to Admission:  Prior to Admission medications   Medication Sig Start Date End Date Taking? Authorizing Provider  Amino Acids-Protein Hydrolys (FEEDING SUPPLEMENT, PRO-STAT SUGAR FREE 64,) LIQD Take 30 mLs by mouth 3 (three) times daily with meals.   Yes Historical Provider, MD  Arginine POWD Take 1 packet by mouth 2 (two) times daily.   Yes Historical Provider, MD  baclofen (LIORESAL) 10 MG tablet Take 10 mg by mouth 3 (three) times daily.   Yes Historical Provider, MD  bisacodyl (DULCOLAX) 5 MG EC tablet Take 10 mg by mouth daily as needed for moderate constipation.   Yes  Historical Provider, MD  chlorhexidine (PERIDEX) 0.12 % solution Use as directed 10 mLs in the mouth or throat 2 (two) times daily.   Yes Historical Provider, MD  cholecalciferol (VITAMIN D) 1000 units tablet Take 2,000 Units by mouth daily.   Yes Historical Provider, MD  divalproex (DEPAKOTE SPRINKLE) 125 MG capsule Take 125-500 mg by mouth 2 (two) times daily. The patient takes 125 mg qam and 500 mg at bedtime   Yes Historical Provider, MD  doxycycline (VIBRAMYCIN) 100 MG capsule Take 100 mg by mouth 2 (two) times daily. 12/12/15 12/22/15 Yes Historical Provider, MD  fentaNYL (DURAGESIC - DOSED MCG/HR) 50 MCG/HR Place 25 mcg onto the skin every 3 (three) days.   Yes Historical Provider, MD  Interferon Beta-1b (BETASERON [place]) Inject 0.3 mg into the skin every other day.   Yes Historical Provider, MD  lactulose (CHRONULAC) 10 GM/15ML solution Take 20 g by mouth 2 (two) times daily as needed for mild  constipation.   Yes Historical Provider, MD  Multiple Vitamins-Minerals (DECUBI-VITE PO) Take 1 tablet by mouth daily.   Yes Historical Provider, MD  Nutritional Supplements (RESOURCE 2.0) LIQD Take 60 mLs by mouth 2 (two) times daily.   Yes Historical Provider, MD  oxycodone (OXY-IR) 5 MG capsule Take 5 mg by mouth every 6 (six) hours as needed for pain.   Yes Historical Provider, MD  oxycodone (OXY-IR) 5 MG capsule Take 5 mg by mouth 2 (two) times daily.   Yes Historical Provider, MD  saccharomyces boulardii (FLORASTOR) 250 MG capsule Take 250 mg by mouth daily. 12/13/15 12/26/15 Yes Historical Provider, MD   Allergies  Allergen Reactions  . Penicillins Other (See Comments)  . Tape     Review of Systems  Unable to perform ROS   Physical Exam  Constitutional: She appears lethargic. She appears cachectic. She appears ill.  HENT:  Mouth/Throat: Abnormal dentition. Dental abscesses and dental caries present.  -patient was uncooperative with the exam, difficult to see beyond teeth  Cardiovascular:  Normal rate, regular rhythm and normal heart sounds.   Respiratory: She has decreased breath sounds in the right lower field and the left lower field.  Neurological: She appears lethargic.  Skin: Skin is warm and dry.  note per EMR skin breakdowns  Psychiatric: She is agitated. Cognition and memory are impaired. She expresses inappropriate judgment. She exhibits a depressed mood. She exhibits abnormal recent memory and abnormal remote memory.  -at this time refusing to answer questions from myself or her brother    Vital Signs: BP 93/52 mmHg  Pulse 84  Temp(Src) 98.4 F (36.9 C) (Oral)  Resp 16  Ht 5' (1.524 m)  Wt 81.194 kg (179 lb)  BMI 34.96 kg/m2  SpO2 95%  LMP  (LMP Unknown)  SpO2: SpO2: 95 % O2 Device:SpO2: 95 % O2 Flow Rate: .O2 Flow Rate (L/min): 8 L/min  IO: Intake/output summary:  Intake/Output Summary (Last 24 hours) at 12/22/15 1132 Last data filed at 12/21/15 2000  Gross per 24 hour  Intake     60 ml  Output    350 ml  Net   -290 ml    LBM: Last BM Date: 12/19/15 Baseline Weight: Weight: 81.194 kg (179 lb) Most recent weight: Weight: 81.194 kg (179 lb)      Palliative Assessment/Data:  Flowsheet Rows        Most Recent Value   Intake Tab    Referral Department  Hospitalist   Unit at Time of Referral  Med/Surg Unit   Palliative Care Primary Diagnosis  Sepsis/Infectious Disease   Date Notified  12/21/15   Palliative Care Type  New Palliative care   Reason for referral  Clarify Goals of Care   Date of Admission  12/18/15   Date first seen by Palliative Care  12/22/15   # of days Palliative referral response time  1 Day(s)   # of days IP prior to Palliative referral  3   Clinical Assessment    Psychosocial & Spiritual Assessment    Palliative Care Outcomes       Additional Data Reviewed:  CBC:    Component Value Date/Time   WBC 9.2 12/22/2015 0445   HGB 9.1* 12/22/2015 0445   HCT 28.2* 12/22/2015 0445   PLT 307 12/22/2015 0445   MCV 90.1  12/22/2015 0445   NEUTROABS 10.5* 12/18/2015 1404   LYMPHSABS 2.4 12/18/2015 1404   MONOABS 1.2* 12/18/2015 1404   EOSABS 0.0 12/18/2015 1404  BASOSABS 0.0 12/18/2015 1404   Comprehensive Metabolic Panel:    Component Value Date/Time   NA 145 12/22/2015 0445   K 3.1* 12/22/2015 0445   CL 114* 12/22/2015 0445   CO2 22 12/22/2015 0445   BUN 7 12/22/2015 0445   CREATININE 0.51 12/22/2015 0445   GLUCOSE 98 12/22/2015 0445   CALCIUM 7.9* 12/22/2015 0445   AST 92* 12/21/2015 0521   ALT 26 12/21/2015 0521   ALKPHOS 61 12/21/2015 0521   BILITOT 0.6 12/21/2015 0521   PROT 5.3* 12/21/2015 0521   ALBUMIN 1.7* 12/21/2015 0521     Time In: 1100 Time Out: 1200 Time Total: 60 min Greater than 50%  of this time was spent counseling and coordinating care related to the above assessment and plan.  Signed by: Lorinda Creed, NP  Canary Brim, NP  12/22/2015, 11:32 AM  Please contact Palliative Medicine Team phone at [phone] for questions and concerns.

## 2015-12-22 NOTE — Clinical Social Work Note (Signed)
Patient from [facility] (admitted to [facility] 09/06/15). CSW will talk with family to confirm patient's return to the skilled facility.  CSW will facilitate discharge to a skilled facility when medically stable.   Genelle Bal, MSW, LCSW Licensed Clinical Social Worker Clinical Social Work Department [employer] [phone]

## 2015-12-22 NOTE — Progress Notes (Signed)
PROGRESS NOTE  Sheena Porter WUJ:[id number] DOB: [birth year] DOA: 12/18/2015 PCP: Katy Apo, MD  Sheena Porter is a 65 y.o. female with a h/o MS, anemia, brought today via EMS from [facility] rehabilitation due to fever and worsening sacral wounds, right lower leg wounds. The patient was receiving doxycycline 100 mg twice a day for 6 days and Invanz 1 g IV IM x 4 doses prior to presentation. Family reported that for the last 2 days, she had significant decline of her mental status, and oral intake and activity. They are not aware of any sick contacts. She is bedbound due to MS.  Palliative care consulted and waiting recommendations.    Assessment/Plan: Acute respiratory failure with hypoxia due to Healthcare associated pneumonia vs decubitus ulcer -Antibiotics vanc/meropenam.  -Blood cultures- NGTD -urine culture insignificant growth -Oxygen supplementation and wean as able -Nebulizers with Xopenex every 6 hours prn - influenza pcr IS positive .    Fecal impaction -enema and disimpaction -bowel regimen  Sepsis of Unclear Etiology. In the setting of RLL HCAP. Rule out Urine source, pressure ulcer. Patient meets sepsis criteria based on vital sign changes lactic acidosis and hypoxia. Patient's acute respiratory failure likely secondary to RLL pneumonia which is Seen on cxr. UA pending, Initial Lactic acid 1.99. T max 103.2 CBC remarkable for leukocytosis, WBC 15. patient's ABG notable for pH 7.4 , PCO2 50, PO2 128, bicarbonate 30.8. EKG Sinus Tach, QTC 422.  -normal lactic acid and cortisol level is normal.    Hypernatremia - likely due to dehydration Continue hydration  -encourage PO intake  Leukocytosis, likely related to underlying infection resolved  Hypokalemia -replete as needed.  rechek in am, and check mag levels.   Pressure Ulcers Wound Care:  PT for hydrotherapy for the sacral wound to clean up a bit, may need NPWT VAC. Add LALM for pressure  redistribution, most definitely needs low air loss mattress upon return to SNF. Will add enzymatic debridement ointment for the right posterior leg wound to clean yellow slough away. Right heel intact, stable eschar-best practice to leave intact, will offload with Prevalon boots bilaterally since she is high risk.   Abnormal AST, in the setting of sepsis, dehydration. T bil 1.9 , AST 134 Continue hydration and repeat CMET in am.   H/o MS -unable to give injection here as we do not stock in pharmacy  Palliative care consult -not ambulatory, not eating well - goals of care meeting.   Bed bound at SNF due to MS- functional quadriplegia  Code Status: full Family Communication: patient--- family at beside Disposition Plan: SNF when stable= WILL NEED LOW AIR MATTRESS ordered at d/c   Consultants:  WOC  Procedures:     HPI/Subjective: No new complaints.   Objective: Filed Vitals:   12/22/15 0422 12/22/15 0900  BP: 81/67 93/52  Pulse: 86 84  Temp: 98.3 F (36.8 C) 98.4 F (36.9 C)  Resp: 20 16    Intake/Output Summary (Last 24 hours) at 12/22/15 1858 Last data filed at 12/21/15 2000  Gross per 24 hour  Intake      0 ml  Output      0 ml  Net      0 ml   Filed Weights   12/18/15 1156  Weight: 81.194 kg (179 lb)    Exam:   General:  Appears exhausted  Cardiovascular: rrr,s1s2.   Respiratory: clear, diminished at bases.   Abdomen: +BS, softer, not distended  Musculoskeletal: wearing prevlon boots  Data Reviewed:  Basic Metabolic Panel:  Recent Labs Lab 12/18/15 1404 12/19/15 0637 12/20/15 0512 12/21/15 0521 12/22/15 0445  NA 152* 153* 155* 148* 145  K 3.8 4.5 3.2* 3.5 3.1*  CL 112* 120* 120* 116* 114*  CO2 27 24 27 25 22   GLUCOSE 119* 88 94 116* 98  BUN 24* 20 12 10 7   CREATININE 0.97 0.57 0.53 0.67 0.51  CALCIUM 7.9* 7.7* 8.1* 7.8* 7.9*  MG 2.0  --   --   --   --   PHOS 3.7  --   --   --   --    Liver Function Tests:  Recent Labs Lab  12/18/15 1158 12/18/15 1404 12/19/15 0637 12/21/15 0521  AST 134* 115* 98* 92*  ALT 35 31 27 26   ALKPHOS 87 72 65 61  BILITOT 1.9* 1.8* 1.1 0.6  PROT 6.9 5.8* 5.2* 5.3*  ALBUMIN 2.3* 1.9* 1.8* 1.7*   No results for input(s): LIPASE, AMYLASE in the last 168 hours. No results for input(s): AMMONIA in the last 168 hours. CBC:  Recent Labs Lab 12/18/15 1158 12/18/15 1404 12/19/15 0637 12/20/15 0512 12/21/15 0521 12/22/15 0445  WBC 15.6* 14.1* 15.5* 8.1 7.2 9.2  NEUTROABS 12.6* 10.5*  --   --   --   --   HGB 12.8 11.2* 10.5* 9.4* 9.3* 9.1*  HCT 40.9 36.3 34.0* 32.0* 29.7* 28.2*  MCV 90.3 90.5 89.2 91.4 92.0 90.1  PLT 298 248 243 269 283 307   Cardiac Enzymes: No results for input(s): CKTOTAL, CKMB, CKMBINDEX, TROPONINI in the last 168 hours. BNP (last 3 results) No results for input(s): BNP in the last 8760 hours.  ProBNP (last 3 results) No results for input(s): PROBNP in the last 8760 hours.  CBG:  Recent Labs Lab 12/20/15 0815 12/20/15 1240 12/20/15 1729 12/21/15 0741 12/22/15 0732  GLUCAP 112* 110* 96 119* 95    Recent Results (from the past 240 hour(s))  Blood Culture (routine x 2)     Status: None (Preliminary result)   Collection Time: 12/18/15 11:36 AM  Result Value Ref Range Status   Specimen Description BLOOD RIGHT ANTECUBITAL  Final   Special Requests BOTTLES DRAWN AEROBIC AND ANAEROBIC 10CC  Final   Culture NO GROWTH 4 DAYS  Final   Report Status PENDING  Incomplete  Blood Culture (routine x 2)     Status: None (Preliminary result)   Collection Time: 12/18/15 11:37 AM  Result Value Ref Range Status   Specimen Description BLOOD LEFT ANTECUBITAL  Final   Special Requests BOTTLES DRAWN AEROBIC AND ANAEROBIC 5CC  Final   Culture NO GROWTH 4 DAYS  Final   Report Status PENDING  Incomplete  Urine culture     Status: None   Collection Time: 12/18/15  1:35 PM  Result Value Ref Range Status   Specimen Description URINE, CATHETERIZED  Final   Special  Requests NONE  Final   Culture 1,000 COLONIES/mL INSIGNIFICANT GROWTH  Final   Report Status 12/19/2015 FINAL  Final  MRSA PCR Screening     Status: None   Collection Time: 12/18/15  3:22 PM  Result Value Ref Range Status   MRSA by PCR NEGATIVE NEGATIVE Final    Comment:        The GeneXpert MRSA Assay (FDA approved for NASAL specimens only), is one component of a comprehensive MRSA colonization surveillance program. It is not intended to diagnose MRSA infection nor to guide or monitor treatment for MRSA infections.   Urine culture  Status: None   Collection Time: 12/19/15  4:41 AM  Result Value Ref Range Status   Specimen Description URINE, CATHETERIZED  Final   Special Requests NONE  Final   Culture NO GROWTH 1 DAY  Final   Report Status 12/20/2015 FINAL  Final     Studies: Dg Chest Port 1 View  12/21/2015  CLINICAL DATA:  Fever EXAM: PORTABLE CHEST - 1 VIEW COMPARISON:  12/20/2015 FINDINGS: Low lung volumes. Left lower lung airspace disease and retrocardiac consolidation/ atelectasis persists. There is some patchy airspace opacity in the anterior right upper lobe as before. No new infiltrate or edema. Heart size upper limits normal for technique. No effusion.  No pneumothorax. Fixation hardware in the left humerus, incompletely visualized. IMPRESSION: 1. Little change in asymmetric left lower lobe and right upper lobe airspace disease. Electronically Signed   By: Corlis Leak M.D.   On: 12/21/2015 09:42    Scheduled Meds: . aspirin EC  81 mg Oral Daily  . baclofen  10 mg Oral TID  . chlorhexidine  10 mL Mouth/Throat BID  . collagenase   Topical Daily  . divalproex  125 mg Oral Daily  . divalproex  500 mg Oral QHS  . docusate sodium  100 mg Oral BID  . enoxaparin (LOVENOX) injection  40 mg Subcutaneous Q24H  . feeding supplement  1 Container Oral TID BM  . feeding supplement (PRO-STAT SUGAR FREE 64)  30 mL Oral TID WC  . fentaNYL  25 mcg Transdermal Q72H  . guaiFENesin   600 mg Oral BID  . meropenem (MERREM) IV  1 g Intravenous Q8H  . oseltamivir  75 mg Oral BID  . oxyCODONE  5 mg Oral BID  . potassium chloride  10 mEq Intravenous Once  . potassium chloride  40 mEq Oral BID  . saccharomyces boulardii  250 mg Oral Daily  . vancomycin  750 mg Intravenous Q12H   Continuous Infusions: . dextrose 5 % and 0.45% NaCl 100 mL/hr at 12/22/15 0818   Antibiotics Given (last 72 hours)    Date/Time Action Medication Dose Rate   12/19/15 2231 Given   aztreonam (AZACTAM) 2 g in dextrose 5 % 50 mL IVPB 2 g 100 mL/hr   12/20/15 0522 Given   aztreonam (AZACTAM) 2 g in dextrose 5 % 50 mL IVPB 2 g 100 mL/hr   12/20/15 0523 Given   vancomycin (VANCOCIN) IVPB 750 mg/150 ml premix 750 mg 150 mL/hr   12/20/15 1537 Given   aztreonam (AZACTAM) 2 g in dextrose 5 % 50 mL IVPB 2 g 100 mL/hr   12/20/15 1724 Given   vancomycin (VANCOCIN) IVPB 750 mg/150 ml premix 750 mg 150 mL/hr   12/20/15 2129 Given   aztreonam (AZACTAM) 2 g in dextrose 5 % 50 mL IVPB 2 g 100 mL/hr   12/21/15 0435 Given   vancomycin (VANCOCIN) IVPB 750 mg/150 ml premix 750 mg 150 mL/hr   12/21/15 0546 Given   aztreonam (AZACTAM) 2 g in dextrose 5 % 50 mL IVPB 2 g 100 mL/hr   12/21/15 1119 Given   meropenem (MERREM) 1 g in sodium chloride 0.9 % 100 mL IVPB 1 g 200 mL/hr   12/21/15 1832 Given   meropenem (MERREM) 1 g in sodium chloride 0.9 % 100 mL IVPB 1 g 200 mL/hr   12/21/15 1901 Given  [was due at 1600-pt had no IV acces and then an 1800 abx was running.]   vancomycin (VANCOCIN) IVPB 750 mg/150 ml premix  750 mg 150 mL/hr   12/21/15 2233 Given   oseltamivir (TAMIFLU) capsule 75 mg 75 mg    12/22/15 0243 Given   meropenem (MERREM) 1 g in sodium chloride 0.9 % 100 mL IVPB 1 g 200 mL/hr   12/22/15 0350 Given   vancomycin (VANCOCIN) IVPB 750 mg/150 ml premix 750 mg 150 mL/hr   12/22/15 1220 Given   meropenem (MERREM) 1 g in sodium chloride 0.9 % 100 mL IVPB 1 g 200 mL/hr   12/22/15 1221 Given    oseltamivir (TAMIFLU) capsule 75 mg 75 mg    12/22/15 1701 Given   vancomycin (VANCOCIN) IVPB 750 mg/150 ml premix 750 mg 150 mL/hr   12/22/15 1718 Given   meropenem (MERREM) 1 g in sodium chloride 0.9 % 100 mL IVPB 1 g 200 mL/hr      Principal Problem:   Sepsis due to pneumonia (HCC) Active Problems:   HCAP (healthcare-associated pneumonia)   Multiple sclerosis (HCC)   Leukocytosis   Pressure ulcer    Time spent: 25 min    Carah Barrientes  [facility] Pager [phone]. If 7PM-7AM, please contact night-coverage at [email or web address], password [facility] 12/22/2015, 6:58 PM  LOS: 4 days

## 2015-12-22 NOTE — Progress Notes (Signed)
Physical Therapy Wound Treatment Patient Details  Name: Sheena Porter MRN: [id number] Date of Birth: 05-12-1951  Today's Date: 12/22/2015 Time: 2998-6037 Time Calculation (min): 39 min  Subjective  Subjective: "It hurts more today" Patient and Family Stated Goals: Pt unable to participate in goal setting Date of Onset:  (Prior to admission)  Pain Score: Pt verbally reports 10/10 pain during pulsed lavage portion of treatment. Faces pain scale ~4/10.   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 (Active)  Dressing Type Barrier Film (skin prep);Gauze (Comment);Moist to dry 12/22/2015  9:41 AM  Dressing Clean;Dry;Intact 12/22/2015  9:41 AM  Dressing Change Frequency Other (Comment) 12/22/2015  9:41 AM  State of Healing Eschar 12/22/2015  9:41 AM  Site / Wound Assessment Brown;Pink;Yellow;Red 12/22/2015  9:41 AM  % Wound base Red or Granulating 45% 12/22/2015  9:41 AM  % Wound base Yellow 40% 12/22/2015  9:41 AM  % Wound base Black 15% 12/22/2015  9:41 AM  Peri-wound Assessment Intact 12/22/2015  9:41 AM  Wound Length (cm) 6.5 cm 12/20/2015 10:37 AM  Wound Width (cm) 10 cm 12/20/2015 10:37 AM  Wound Depth (cm) 2.5 cm 12/20/2015 10:37 AM  Undermining (cm) 2 12/20/2015 10:37 AM  Margins Unattached edges (unapproximated) 12/22/2015  9:41 AM  Drainage Amount Moderate 12/22/2015  9:41 AM  Drainage Description Serosanguineous 12/22/2015  9:41 AM  Treatment Debridement (Selective);Hydrotherapy (Pulse lavage);Packing (Saline gauze) 12/22/2015  9:41 AM     Pressure Ulcer 12/18/15 Unstageable - Full thickness tissue loss in which the base of the ulcer is covered by slough (yellow, tan, gray, green or brown) and/or eschar (tan, brown or black) in the wound bed. (Active)  Dressing Type Foam 12/21/2015  7:59 PM  Dressing Clean;Dry;Intact 12/21/2015  7:59 PM  Drainage Amount None 12/21/2015  7:59 PM     Pressure Ulcer 12/18/15 Stage II -   Partial thickness loss of dermis presenting as a shallow open ulcer with a red, pink wound bed without slough. (Active)  Dressing Type Foam 12/21/2015  8:00 AM  Dressing Changed 12/21/2015  8:00 AM  Drainage Amount Scant 12/21/2015  8:00 AM   Hydrotherapy Pulsed lavage therapy - wound location: Sacrum Pulsed Lavage with Suction (psi): 8 psi Pulsed Lavage with Suction - Normal Saline Used: 1000 mL Pulsed Lavage Tip: Tip with splash shield Selective Debridement Selective Debridement - Location: Sacrum Selective Debridement - Tools Used: Forceps;Scissors Selective Debridement - Tissue Removed: brown and yellow necrotic tissue   Wound Assessment and Plan  Wound Therapy - Assess/Plan/Recommendations Wound Therapy - Clinical Statement: Pt tolerated hydrotherapy and selective debridement of non-viable tissues. Wound Therapy - Functional Problem List: Decreased mobility and OOB due to pressure injury. Factors Delaying/Impairing Wound Healing: Immobility;Multiple medical problems Hydrotherapy Plan: Debridement;Dressing change;Patient/family education;Pulsatile lavage with suction Wound Therapy - Frequency: 6X / week Wound Therapy - Follow Up Recommendations: Skilled nursing facility Wound Plan: See above  Wound Therapy Goals- Improve the function of patient's integumentary system by progressing the wound(s) through the phases of wound healing (inflammation - proliferation - remodeling) by: Decrease Necrotic Tissue to: 20% Decrease Necrotic Tissue - Progress: Progressing toward goal Increase Granulation Tissue to: 80% Increase Granulation Tissue - Progress: Progressing toward goal Goals/treatment plan/discharge plan were made with and agreed upon by patient/family: No, Patient unable to participate in goals/treatment/discharge plan and family unavailable Time For Goal Achievement: 7 days Wound Therapy - Potential for Goals: Good  Goals will be updated until maximal  potential achieved or  discharge criteria met.  Discharge criteria: when goals achieved, discharge from hospital, MD decision/surgical intervention, no progress towards goals, refusal/missing three consecutive treatments without notification or medical reason.  GP     Rolinda Roan 12/22/2015, 9:45 AM   Rolinda Roan, PT, DPT Acute Rehabilitation Services Pager: [phone]

## 2015-12-23 DIAGNOSIS — Z515 Encounter for palliative care: Secondary | ICD-10-CM | POA: Diagnosis present

## 2015-12-23 DIAGNOSIS — Z7189 Other specified counseling: Secondary | ICD-10-CM | POA: Diagnosis present

## 2015-12-23 DIAGNOSIS — F4321 Adjustment disorder with depressed mood: Secondary | ICD-10-CM | POA: Diagnosis present

## 2015-12-23 LAB — CULTURE, BLOOD (ROUTINE X 2)
CULTURE: NO GROWTH
Culture: NO GROWTH

## 2015-12-23 LAB — CBC
HCT: 25.7 % — ABNORMAL LOW (ref 36.0–46.0)
Hemoglobin: 8.1 g/dL — ABNORMAL LOW (ref 12.0–15.0)
MCH: 27.6 pg (ref 26.0–34.0)
MCHC: 31.5 g/dL (ref 30.0–36.0)
MCV: 87.4 fL (ref 78.0–100.0)
PLATELETS: 358 10*3/uL (ref 150–400)
RBC: 2.94 MIL/uL — AB (ref 3.87–5.11)
RDW: 15.1 % (ref 11.5–15.5)
WBC: 8.5 10*3/uL (ref 4.0–10.5)

## 2015-12-23 LAB — BASIC METABOLIC PANEL
Anion gap: 10 (ref 5–15)
CALCIUM: 7.8 mg/dL — AB (ref 8.9–10.3)
CO2: 22 mmol/L (ref 22–32)
Chloride: 110 mmol/L (ref 101–111)
Creatinine, Ser: 0.49 mg/dL (ref 0.44–1.00)
GFR calc Af Amer: >60 mL/min (ref >60)
GLUCOSE: 101 mg/dL — AB (ref 65–99)
Potassium: 3.2 mmol/L — ABNORMAL LOW (ref 3.5–5.1)
Sodium: 142 mmol/L (ref 135–145)

## 2015-12-23 LAB — GLUCOSE, CAPILLARY: Glucose-Capillary: 97 mg/dL (ref 65–99)

## 2015-12-23 LAB — MAGNESIUM: Magnesium: 1.6 mg/dL — ABNORMAL LOW (ref 1.7–2.4)

## 2015-12-23 MED ORDER — ENSURE ENLIVE PO LIQD: 237.0000 mL | Freq: Two times a day (BID) | ORAL | Status: DC

## 2015-12-23 MED ORDER — MAGNESIUM SULFATE 2 GM/50ML IV SOLN
2.0000 g | Freq: Once | INTRAVENOUS | Status: AC
  Administered 2015-12-23: 2 g via INTRAVENOUS
  Filled 2015-12-23: qty 50

## 2015-12-23 MED ORDER — POTASSIUM CHLORIDE CRYS ER 20 MEQ PO TBCR
40.0000 meq | EXTENDED_RELEASE_TABLET | Freq: Two times a day (BID) | ORAL | Status: AC
  Administered 2015-12-24: 40 meq via ORAL
  Filled 2015-12-23 (×2): qty 2

## 2015-12-23 MED ORDER — CITALOPRAM HYDROBROMIDE 20 MG PO TABS
10.0000 mg | ORAL_TABLET | Freq: Every day | ORAL | Status: DC
  Administered 2015-12-24 – 2015-12-28 (×4): 10 mg via ORAL
  Filled 2015-12-23 (×7): qty 1

## 2015-12-23 MED ORDER — BOOST / RESOURCE BREEZE PO LIQD: 1.0000 | Freq: Two times a day (BID) | ORAL | Status: DC

## 2015-12-23 NOTE — Progress Notes (Signed)
PROGRESS NOTE  Rovena Gaylord UEA:[id number] DOB: [birth year] DOA: 12/18/2015 PCP: Katy Apo, MD  Brynna Sweaney is a 65 y.o. female with a h/o MS, anemia, brought today via EMS from [facility] rehabilitation due to fever and worsening sacral wounds, right lower leg wounds. The patient was receiving doxycycline 100 mg twice a day for 6 days and Invanz 1 g IV IM x 4 doses prior to presentation. Family reported that for the last 2 days, she had significant decline of her mental status, and oral intake and activity. They are not aware of any sick contacts. She is bedbound due to MS.  Palliative care consulted and waiting recommendations.    Assessment/Plan: Acute respiratory failure with hypoxia due to Healthcare associated pneumonia vs decubitus ulcer -Antibiotics vanc/meropenam. Day 6 of antibiotics. Can d/c antibiotics after tomorrow's dose.  -Blood cultures- NGTD -urine culture insignificant growth -Oxygen supplementation and wean as able -Nebulizers with Xopenex every 6 hours prn - influenza pcr IS positive .    Fecal impaction -enema and disimpaction -bowel regimen  Sepsis of Unclear Etiology. In the setting of RLL HCAP. Rule out Urine source, pressure ulcer. Patient meets sepsis criteria based on vital sign changes lactic acidosis and hypoxia. Patient's acute respiratory failure likely secondary to RLL pneumonia . Completed about 6 days of antibiotics, plan to d/c after tomorrow's dose . Low grade temp of 99.8 over the last 24 hours. Wbc count normalized.  -normal lactic acid and cortisol level is normal.    Hypernatremia - likely due to dehydration Continue hydration resolved.  -encourage PO intake  Leukocytosis, likely related to underlying infection resolved  Hypokalemia -replete as needed.    Hypomagnesemia: replete as needed. Repeat levels in am.   Pressure Ulcers Wound Care:  PT for hydrotherapy for the sacral wound to clean up a bit, may need NPWT VAC.  Add LALM for pressure redistribution, most definitely needs low air loss mattress upon return to SNF. Will add enzymatic debridement ointment for the right posterior leg wound to clean yellow slough away. Right heel intact, stable eschar-best practice to leave intact, will offload with Prevalon boots bilaterally since she is high risk.  Palliative care consulted and family wanted to treat the medical issues and would like palliative care services to follow up at the SNF when she is discharged. If she remains in the hospital on Monday, they would like to get in touch with palliative care services here and go over the goals of care and make a decision regarding the plan.   Abnormal AST, in the setting of sepsis, dehydration. T bil 1.9 , AST 134 Continue hydration and repeat CMET in am.   H/o MS -unable to give injection here as we do not stock in pharmacy  Palliative care consult -not ambulatory, not eating well - goals of care meeting.  Palliative care consulted and family wanted to treat the medical issues and would like palliative care services to follow up at the SNF when she is discharged. If she remains in the hospital on Monday, they would like to get in touch with palliative care services here and go over the goals of care and make a decision regarding the plan.   Bed bound at SNF due to MS- functional quadriplegia  Code Status: full Family Communication: patient--- none at bedside.  Disposition Plan: SNF when stable, possibly on Monday. = WILL NEED LOW AIR MATTRESS ordered at d/c   Consultants:  WOC  Procedures: none  HPI/Subjective: No new complaints. She  is calm and denies any new complaints.   Objective: Filed Vitals:   12/23/15 0504 12/23/15 0900  BP: 109/56 113/70  Pulse: 94 88  Temp: 99 F (37.2 C) 99 F (37.2 C)  Resp: 19 18    Intake/Output Summary (Last 24 hours) at 12/23/15 1806 Last data filed at 12/23/15 1400  Gross per 24 hour  Intake      0 ml    Output    850 ml  Net   -850 ml   Filed Weights   12/18/15 1156  Weight: 81.194 kg (179 lb)    Exam:   General:  Appears exhausted  Cardiovascular: rrr,s1s2.   Respiratory: clear, diminished at bases.   Abdomen: +BS, softer, not distended  Musculoskeletal: wearing prevlon boots  Data Reviewed: Basic Metabolic Panel:  Recent Labs Lab 12/18/15 1404 12/19/15 0637 12/20/15 0512 12/21/15 0521 12/22/15 0445 12/23/15 0702  NA 152* 153* 155* 148* 145 142  K 3.8 4.5 3.2* 3.5 3.1* 3.2*  CL 112* 120* 120* 116* 114* 110  CO2 27 24 27 25 22 22   GLUCOSE 119* 88 94 116* 98 101*  BUN 24* 20 12 10 7  <5*  CREATININE 0.97 0.57 0.53 0.67 0.51 0.49  CALCIUM 7.9* 7.7* 8.1* 7.8* 7.9* 7.8*  MG 2.0  --   --   --   --  1.6*  PHOS 3.7  --   --   --   --   --    Liver Function Tests:  Recent Labs Lab 12/18/15 1158 12/18/15 1404 12/19/15 0637 12/21/15 0521  AST 134* 115* 98* 92*  ALT 35 31 27 26   ALKPHOS 87 72 65 61  BILITOT 1.9* 1.8* 1.1 0.6  PROT 6.9 5.8* 5.2* 5.3*  ALBUMIN 2.3* 1.9* 1.8* 1.7*   No results for input(s): LIPASE, AMYLASE in the last 168 hours. No results for input(s): AMMONIA in the last 168 hours. CBC:  Recent Labs Lab 12/18/15 1158 12/18/15 1404 12/19/15 1610 12/20/15 0512 12/21/15 0521 12/22/15 0445 12/23/15 0702  WBC 15.6* 14.1* 15.5* 8.1 7.2 9.2 8.5  NEUTROABS 12.6* 10.5*  --   --   --   --   --   HGB 12.8 11.2* 10.5* 9.4* 9.3* 9.1* 8.1*  HCT 40.9 36.3 34.0* 32.0* 29.7* 28.2* 25.7*  MCV 90.3 90.5 89.2 91.4 92.0 90.1 87.4  PLT 298 248 243 269 283 307 358   Cardiac Enzymes: No results for input(s): CKTOTAL, CKMB, CKMBINDEX, TROPONINI in the last 168 hours. BNP (last 3 results) No results for input(s): BNP in the last 8760 hours.  ProBNP (last 3 results) No results for input(s): PROBNP in the last 8760 hours.  CBG:  Recent Labs Lab 12/20/15 1240 12/20/15 1729 12/21/15 0741 12/22/15 0732 12/23/15 0747  GLUCAP 110* 96 119* 95 97     Recent Results (from the past 240 hour(s))  Blood Culture (routine x 2)     Status: None   Collection Time: 12/18/15 11:36 AM  Result Value Ref Range Status   Specimen Description BLOOD RIGHT ANTECUBITAL  Final   Special Requests BOTTLES DRAWN AEROBIC AND ANAEROBIC 10CC  Final   Culture NO GROWTH 5 DAYS  Final   Report Status 12/23/2015 FINAL  Final  Blood Culture (routine x 2)     Status: None   Collection Time: 12/18/15 11:37 AM  Result Value Ref Range Status   Specimen Description BLOOD LEFT ANTECUBITAL  Final   Special Requests BOTTLES DRAWN AEROBIC AND ANAEROBIC 5CC  Final  Culture NO GROWTH 5 DAYS  Final   Report Status 12/23/2015 FINAL  Final  Urine culture     Status: None   Collection Time: 12/18/15  1:35 PM  Result Value Ref Range Status   Specimen Description URINE, CATHETERIZED  Final   Special Requests NONE  Final   Culture 1,000 COLONIES/mL INSIGNIFICANT GROWTH  Final   Report Status 12/19/2015 FINAL  Final  MRSA PCR Screening     Status: None   Collection Time: 12/18/15  3:22 PM  Result Value Ref Range Status   MRSA by PCR NEGATIVE NEGATIVE Final    Comment:        The GeneXpert MRSA Assay (FDA approved for NASAL specimens only), is one component of a comprehensive MRSA colonization surveillance program. It is not intended to diagnose MRSA infection nor to guide or monitor treatment for MRSA infections.   Urine culture     Status: None   Collection Time: 12/19/15  4:41 AM  Result Value Ref Range Status   Specimen Description URINE, CATHETERIZED  Final   Special Requests NONE  Final   Culture NO GROWTH 1 DAY  Final   Report Status 12/20/2015 FINAL  Final     Studies: No results found.  Scheduled Meds: . aspirin EC  81 mg Oral Daily  . baclofen  10 mg Oral TID  . chlorhexidine  10 mL Mouth/Throat BID  . citalopram  10 mg Oral Daily  . collagenase   Topical Daily  . divalproex  125 mg Oral Daily  . divalproex  500 mg Oral QHS  . docusate  sodium  100 mg Oral BID  . enoxaparin (LOVENOX) injection  40 mg Subcutaneous Q24H  . [START ON 12/24/2015] feeding supplement  1 Container Oral BID BM  . feeding supplement (ENSURE ENLIVE)  237 mL Oral BID BM  . feeding supplement (PRO-STAT SUGAR FREE 64)  30 mL Oral TID WC  . fentaNYL  25 mcg Transdermal Q72H  . guaiFENesin  600 mg Oral BID  . magnesium sulfate 1 - 4 g bolus IVPB  2 g Intravenous Once  . meropenem (MERREM) IV  1 g Intravenous Q8H  . oseltamivir  75 mg Oral BID  . oxyCODONE  5 mg Oral BID  . potassium chloride  10 mEq Intravenous Once  . potassium chloride  40 mEq Oral BID  . saccharomyces boulardii  250 mg Oral Daily  . vancomycin  750 mg Intravenous Q12H   Continuous Infusions: . dextrose 5 % and 0.45% NaCl 100 mL/hr at 12/23/15 0227   Antibiotics Given (last 72 hours)    Date/Time Action Medication Dose Rate   12/20/15 2129 Given   aztreonam (AZACTAM) 2 g in dextrose 5 % 50 mL IVPB 2 g 100 mL/hr   12/21/15 0435 Given   vancomycin (VANCOCIN) IVPB 750 mg/150 ml premix 750 mg 150 mL/hr   12/21/15 0546 Given   aztreonam (AZACTAM) 2 g in dextrose 5 % 50 mL IVPB 2 g 100 mL/hr   12/21/15 1119 Given   meropenem (MERREM) 1 g in sodium chloride 0.9 % 100 mL IVPB 1 g 200 mL/hr   12/21/15 1832 Given   meropenem (MERREM) 1 g in sodium chloride 0.9 % 100 mL IVPB 1 g 200 mL/hr   12/21/15 1901 Given  [was due at 1600-pt had no IV acces and then an 1800 abx was running.]   vancomycin (VANCOCIN) IVPB 750 mg/150 ml premix 750 mg 150 mL/hr   12/21/15 2233  Given   oseltamivir (TAMIFLU) capsule 75 mg 75 mg    12/22/15 0243 Given   meropenem (MERREM) 1 g in sodium chloride 0.9 % 100 mL IVPB 1 g 200 mL/hr   12/22/15 0350 Given   vancomycin (VANCOCIN) IVPB 750 mg/150 ml premix 750 mg 150 mL/hr   12/22/15 1220 Given   meropenem (MERREM) 1 g in sodium chloride 0.9 % 100 mL IVPB 1 g 200 mL/hr   12/22/15 1221 Given   oseltamivir (TAMIFLU) capsule 75 mg 75 mg    12/22/15 1701 Given    vancomycin (VANCOCIN) IVPB 750 mg/150 ml premix 750 mg 150 mL/hr   12/22/15 1718 Given   meropenem (MERREM) 1 g in sodium chloride 0.9 % 100 mL IVPB 1 g 200 mL/hr   12/22/15 2157 Given   oseltamivir (TAMIFLU) capsule 75 mg 75 mg    12/23/15 0227 Given   meropenem (MERREM) 1 g in sodium chloride 0.9 % 100 mL IVPB 1 g 200 mL/hr   12/23/15 0449 Given   vancomycin (VANCOCIN) IVPB 750 mg/150 ml premix 750 mg 150 mL/hr   12/23/15 1016 Given   oseltamivir (TAMIFLU) capsule 75 mg 75 mg    12/23/15 1031 Given   meropenem (MERREM) 1 g in sodium chloride 0.9 % 100 mL IVPB 1 g 200 mL/hr   12/23/15 1512 Given   vancomycin (VANCOCIN) IVPB 750 mg/150 ml premix 750 mg 150 mL/hr   12/23/15 1747 Given   meropenem (MERREM) 1 g in sodium chloride 0.9 % 100 mL IVPB 1 g 200 mL/hr      Principal Problem:   Sepsis due to pneumonia (HCC) Active Problems:   HCAP (healthcare-associated pneumonia)   Multiple sclerosis (HCC)   Leukocytosis   Pressure ulcer   Adjustment disorder with depressed mood   DNR (do not resuscitate) discussion   Palliative care encounter    Time spent: 25 min    [facility]  [facility] Pager [phone]. If 7PM-7AM, please contact night-coverage at [email or web address], password [facility] 12/23/2015, 6:06 PM  LOS: 5 days

## 2015-12-23 NOTE — Progress Notes (Signed)
Nutrition Follow-up  DOCUMENTATION CODES:   Obesity unspecified  INTERVENTION:  Provide Boost Breeze po BID, each supplement provides 250 kcal and 9 grams of protein.  Continue 30 ml Prostat po TID, each supplement provides 100 kcal and 15 grams of protein.   Provide Ensure Enlive po BID, each supplement provides 350 kcal and 20 grams of protein.  Encourage adequate PO intake.   NUTRITION DIAGNOSIS:   Increased nutrient needs related to wound healing as evidenced by estimated needs; ongoing  GOAL:   Patient will meet greater than or equal to 90% of their needs; progressing  MONITOR:   PO intake, Supplement acceptance, Weight trends, Labs, I & O's, Skin, Diet advancement  REASON FOR ASSESSMENT:   Malnutrition Screening Tool    ASSESSMENT:   65 y.o. female with a h/o MS, anemia, brought today via EMS from [facility] rehabilitation due to fever and worsening sacral wounds, right lower leg wounds. Presents with sepsis due to PNA.  Meal completion has been 0-50%. Diet is now a full liquid diet. RD to additionally order Ensure to aid in caloric and protein needs. Pt was encouraged to eat her food at meals and to drink her supplements. RD to continue to monitor.  Low potassium and magnesium.    Diet Order:  Diet full liquid Room service appropriate?: Yes; Fluid consistency:: Thin  Skin:  Wound (see comment) (Stg 4 on sacrum, unstg ulcer on L heel, stg 2 on leg)  Last BM:  3/13  Height:   Ht Readings from Last 1 Encounters:  12/18/15 5' (1.524 m)    Weight:   Wt Readings from Last 1 Encounters:  12/18/15 179 lb (81.194 kg)    Ideal Body Weight:  45.45 kg  BMI:  Body mass index is 34.96 kg/(m^2).  Estimated Nutritional Needs:   Kcal:  1900-2100  Protein:  95-110 grams  Fluid:  1.9- 2.1 L/day  EDUCATION NEEDS:   No education needs identified at this time  Roslyn Smiling, MS, RD, LDN Pager # [phone] After hours/ weekend pager # [phone]

## 2015-12-23 NOTE — Progress Notes (Signed)
SLP Cancellation Note  Patient Details Name: Sheena Porter MRN: [id number] DOB: November 09, 1950   Cancelled treatment:       Reason Eval/Treat Not Completed: Patient refused POs.    Blenda Mounts Laurice 12/23/2015, 3:23 PM

## 2015-12-23 NOTE — Progress Notes (Signed)
Physical Therapy Wound Treatment Patient Details  Name: Sheena Porter MRN: [id number] Date of Birth: 06-09-1951  Today's Date: 12/23/2015 Time: 1325-1411 Time Calculation (min): 46 min  Subjective  Subjective: I'm cold Patient and Family Stated Goals: Pt unable to participate in goal setting Date of Onset:  (Prior to admission)  Pain Score:  Pt complained of increased pain during session. RN provided pain meds before treatment began.  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 (Active)  Dressing Type Foam;Gauze (Comment) 12/23/2015  2:12 PM  Dressing Clean;Dry;Intact 12/23/2015  2:12 PM  Dressing Change Frequency Daily 12/23/2015  2:12 PM  State of Healing Eschar 12/23/2015  2:12 PM  Site / Wound Assessment Pink;Red;Yellow 12/23/2015  2:12 PM  % Wound base Red or Granulating 55% 12/23/2015  2:12 PM  % Wound base Yellow 40% 12/23/2015  2:12 PM  % Wound base Black 5% 12/23/2015  2:12 PM  Peri-wound Assessment Intact 12/23/2015  2:12 PM  Wound Length (cm) 6.5 cm 12/20/2015 10:37 AM  Wound Width (cm) 10 cm 12/20/2015 10:37 AM  Wound Depth (cm) 2.5 cm 12/20/2015 10:37 AM  Undermining (cm) 2 12/20/2015 10:37 AM  Margins Unattached edges (unapproximated) 12/23/2015  2:12 PM  Drainage Amount Moderate 12/23/2015  2:12 PM  Drainage Description Serosanguineous 12/23/2015  2:12 PM  Treatment Debridement (Selective);Hydrotherapy (Pulse lavage);Packing (Saline gauze) 12/23/2015  2:12 PM   Hydrotherapy Pulsed lavage therapy - wound location: Sacrum Pulsed Lavage with Suction (psi): 8 psi Pulsed Lavage with Suction - Normal Saline Used: 1000 mL Pulsed Lavage Tip: Tip with splash shield Selective Debridement Selective Debridement - Location: Sacrum Selective Debridement - Tools Used: Forceps;Scissors Selective Debridement - Tissue Removed: brown and yellow necrotic tissue   Wound Assessment and Plan  Wound Therapy -  Assess/Plan/Recommendations Wound Therapy - Clinical Statement: Pt tolerated hydrotherapy and selective debridement of non-viable tissues. Wound Therapy - Functional Problem List: Decreased mobility and OOB due to pressure injury. Factors Delaying/Impairing Wound Healing: Immobility;Multiple medical problems Hydrotherapy Plan: Debridement;Dressing change;Patient/family education;Pulsatile lavage with suction Wound Therapy - Frequency: 6X / week Wound Therapy - Follow Up Recommendations: Skilled nursing facility Wound Plan: See above  Wound Therapy Goals- Improve the function of patient's integumentary system by progressing the wound(s) through the phases of wound healing (inflammation - proliferation - remodeling) by: Decrease Necrotic Tissue to: 20% Decrease Necrotic Tissue - Progress: Progressing toward goal Increase Granulation Tissue to: 80% Increase Granulation Tissue - Progress: Progressing toward goal Goals/treatment plan/discharge plan were made with and agreed upon by patient/family: No, Patient unable to participate in goals/treatment/discharge plan and family unavailable Time For Goal Achievement: 7 days Wound Therapy - Potential for Goals: Good  Goals will be updated until maximal potential achieved or discharge criteria met.  Discharge criteria: when goals achieved, discharge from hospital, MD decision/surgical intervention, no progress towards goals, refusal/missing three consecutive treatments without notification or medical reason.  GP     Rolinda Roan 12/23/2015, 2:20 PM   Rolinda Roan, PT, DPT Acute Rehabilitation Services Pager: [phone]

## 2015-12-24 DIAGNOSIS — D72829 Elevated white blood cell count, unspecified: Secondary | ICD-10-CM

## 2015-12-24 DIAGNOSIS — J189 Pneumonia, unspecified organism: Secondary | ICD-10-CM

## 2015-12-24 DIAGNOSIS — A419 Sepsis, unspecified organism: Principal | ICD-10-CM

## 2015-12-24 LAB — GLUCOSE, CAPILLARY: Glucose-Capillary: 103 mg/dL — ABNORMAL HIGH (ref 65–99)

## 2015-12-24 MED ORDER — POTASSIUM CHLORIDE CRYS ER 20 MEQ PO TBCR
40.0000 meq | EXTENDED_RELEASE_TABLET | Freq: Two times a day (BID) | ORAL | Status: AC
  Administered 2015-12-24: 40 meq via ORAL
  Filled 2015-12-24 (×2): qty 2

## 2015-12-24 MED ORDER — MAGNESIUM SULFATE 2 GM/50ML IV SOLN: 2.0000 g | Freq: Once | INTRAVENOUS | Status: DC

## 2015-12-24 NOTE — Progress Notes (Signed)
Physical Therapy Wound Treatment Patient Details  Name: Sheena Porter MRN: [id number] Date of Birth: September 25, 1951  Today's Date: 12/24/2015 Time: 09:13-09:50   Subjective  Subjective: No new complaints. Does moan with hydrotherapy, premedication. 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 (Active)  Dressing Type Moist to dry;Gauze (Comment);Foam;Barrier Film (skin prep) 12/24/2015  1:05 PM  Dressing Changed;Clean;Dry;Intact 12/24/2015  1:05 PM  Dressing Change Frequency Daily 12/24/2015  1:05 PM  State of Healing Eschar 12/24/2015  1:05 PM  Site / Wound Assessment Pink;Red;Yellow 12/24/2015  1:05 PM  % Wound base Red or Granulating 55% 12/24/2015  1:05 PM  % Wound base Yellow 40% 12/24/2015  1:05 PM  % Wound base Black 5% 12/24/2015  1:05 PM  Peri-wound Assessment Intact 12/24/2015  1:05 PM  Wound Length (cm) 6.5 cm 12/20/2015 10:37 AM  Wound Width (cm) 10 cm 12/20/2015 10:37 AM  Wound Depth (cm) 2.5 cm 12/20/2015 10:37 AM  Undermining (cm) 2 12/20/2015 10:37 AM  Margins Unattached edges (unapproximated) 12/24/2015  1:05 PM  Drainage Amount Moderate 12/24/2015  1:05 PM  Drainage Description Serosanguineous 12/24/2015  1:05 PM  Treatment Debridement (Selective);Hydrotherapy (Pulse lavage);Packing (Saline gauze) 12/24/2015  1:05 PM      Hydrotherapy Pulsed lavage therapy - wound location: Sacrum Pulsed Lavage with Suction (psi): 8 psi Pulsed Lavage with Suction - Normal Saline Used: 1000 mL Pulsed Lavage Tip: Tip with splash shield Selective Debridement Selective Debridement - Location: Sacrum Selective Debridement - Tools Used: Forceps;Scissors Selective Debridement - Tissue Removed: brown and yellow necrotic tissue   Wound Assessment and Plan  Wound Therapy - Assess/Plan/Recommendations Wound Therapy - Clinical Statement: Mild moaning with  repositioning in bed, no complaints during hydrotherapy and selective debridement. Pt is making slow, steady progress toward goals. Wound Therapy - Functional Problem List: Decreased mobility and OOB due to pressure injury. Factors Delaying/Impairing Wound Healing: Immobility;Multiple medical problems Hydrotherapy Plan: Debridement;Dressing change;Patient/family education;Pulsatile lavage with suction Wound Therapy - Frequency: 6X / week Wound Therapy - Follow Up Recommendations: Skilled nursing facility Wound Plan: See above  Wound Therapy Goals- Improve the function of patient's integumentary system by progressing the wound(s) through the phases of wound healing (inflammation - proliferation - remodeling) by: Decrease Necrotic Tissue to: 20% Decrease Necrotic Tissue - Progress: Progressing toward goal Increase Granulation Tissue to: 80% Increase Granulation Tissue - Progress: Progressing toward goal Goals/treatment plan/discharge plan were made with and agreed upon by patient/family: No, Patient unable to participate in goals/treatment/discharge plan and family unavailable Time For Goal Achievement: 7 days Wound Therapy - Potential for Goals: Good  Goals will be updated until maximal potential achieved or discharge criteria met.  Discharge criteria: when goals achieved, discharge from hospital, MD decision/surgical intervention, no progress towards goals, refusal/missing three consecutive treatments without notification or medical reason.   Willow Ora 12/24/2015, 1:08 PM  Willow Ora, PTA, CLT Acute Rehab Services [phone] 12/24/2015, 1:08 PM

## 2015-12-24 NOTE — Progress Notes (Signed)
Patient refused meds last pm except  Pain meds. Refused  Lunch and dinner and only consumed  Small  quanity of liquids.

## 2015-12-24 NOTE — Progress Notes (Addendum)
Patient ID: Sheena Porter, female   DOB: [birth year], 65 y.o.   MRN: [id number]  [facility] PROGRESS NOTE  Sheena Porter:[id number] DOB: 10-07-1951 DOA: 12/18/2015 PCP: Katy Apo, MD   Brief narrative:    65 y.o. female with known hx of MS, anemia, brought via EMS from [facility] rehabilitation due to fever and worsening sacral wounds, right lower leg wounds. The patient was receiving doxycycline 100 mg twice a day for 6 days and Invanz 1 g IV IM x 4 doses prior to presentation.  Assessment/Plan:    Acute respiratory failure with hypoxia due to Healthcare associated pneumonia, influenza PNA - Antibiotics vanc/meropenam. Day 7/8 of antibiotics. Can d/c antibiotics after tomorrow's dose.  - Blood cultures - NGTD - urine culture insignificant growth - Oxygen supplementation and wean as able - Nebulizers with Xopenex every 6 hours prn - continue tamiflu   Fecal impaction - enema and disimpaction - bowel regimen  Sepsis of Unclear Etiology. In the setting of RLL HCAP. Influenza A positive   - Patient meets sepsis criteria based on vital sign changes lactic acidosis and hypoxia.  - Patient's acute respiratory failure likely secondary to RLL pneumonia, Influenza A  - still with Tmax 102 so will keep one more day on ABX and plan d/c in AM - continue Tamiflu to complete therapy  - leukocytosis resolved   Hypernatremia - likely due to dehydration - resolved  - diet advanced   Hypokalemia - still low, continue to supplement  - BMP In AM  Hypomagnesemia - check level in AM and supplement as indicated   Pressure Ulcers - PT for hydrotherapy for the sacral wound to clean up a bit, may need NPWT VAC - needs low air loss mattress upon return to SNF  Transaminitis  - trending down   H/o MS - unable to give injection here as we do not stock in pharmacy  Palliative care consult  -not ambulatory, not eating well - goals of care meeting.   Bed bound at SNF  due to MS -  functional quadriplegia  DVT prophylaxis - Lovenox SQ  Code Status: Full Family Communication:  plan of care discussed with the patient Disposition Plan: SNF by 3/20  IV access:  Peripheral IV  Procedures and diagnostic studies:    Dg Chest Port 1 View 2016-01-15  Little change in asymmetric left lower lobe and right upper lobe airspace disease.   Dg Chest Port 1 View 12/19/2015 Stable left lower lobe consolidation. New mild patchy right upper parahilar opacities. Differential includes atelectasis and/or pneumonia.   Dg Chest Port 1 View 12/18/2015 Right lower lobe consolidation and air bronchograms, concerning for pneumonia. Followup PA and lateral chest X-ray is recommended in 3-4 weeks following trial of antibiotic therapy to ensure resolution and exclude underlying malignancy.   Dg Abd Portable 1v 12/19/2015  Large amount of stool is noted in the rectum concerning for impaction. No abnormal bowel dilatation is noted.  Medical Consultants:  WOC  Other Consultants:  PT  IAnti-Infectives:   Vancomycin 3/12 --> Meropenem 3/12 -->  Debbora Presto, MD  [facility] Pager [phone]  If 7PM-7AM, please contact night-coverage [email or web address] Password [facility] 12/24/2015, 5:54 PM   LOS: 6 days   HPI/Subjective: No events overnight.   Objective: Filed Vitals:   12/23/15 2252 12/24/15 0516 12/24/15 0915 12/24/15 1607  BP: 114/87 112/69 114/57 150/67  Pulse:  90 102 109  Temp: 99.2 F (37.3 C) 98.9 F (37.2 C) 102.1 F (38.9 C) 99.2 F (  37.3 C)  TempSrc: Oral Oral Axillary Oral  Resp: 19 20 19 18   Height:      Weight: 81.5 kg (179 lb 10.8 oz)     SpO2:  99% 93% 100%    Intake/Output Summary (Last 24 hours) at 12/24/15 1754 Last data filed at 12/24/15 1608  Gross per 24 hour  Intake    120 ml  Output   2150 ml  Net  -2030 ml    Exam:   General:  Pt is somnolent but easy to awake, appears ill, weak   Cardiovascular: Regular rate and rhythm, no rubs, no  gallops  Respiratory: Clear to auscultation bilaterally, rhonchi at bases with diminished breath sounds at bases   Abdomen: Soft, non tender, non distended, bowel sounds present, no guarding  Data Reviewed: Basic Metabolic Panel:  Recent Labs Lab 12/18/15 1404 12/19/15 0637 12/20/15 0512 12/21/15 0521 12/22/15 0445 12/23/15 0702  NA 152* 153* 155* 148* 145 142  K 3.8 4.5 3.2* 3.5 3.1* 3.2*  CL 112* 120* 120* 116* 114* 110  CO2 27 24 27 25 22 22   GLUCOSE 119* 88 94 116* 98 101*  BUN 24* 20 12 10 7  <5*  CREATININE 0.97 0.57 0.53 0.67 0.51 0.49  CALCIUM 7.9* 7.7* 8.1* 7.8* 7.9* 7.8*  MG 2.0  --   --   --   --  1.6*  PHOS 3.7  --   --   --   --   --    Liver Function Tests:  Recent Labs Lab 12/18/15 1158 12/18/15 1404 12/19/15 0637 12/21/15 0521  AST 134* 115* 98* 92*  ALT 35 31 27 26   ALKPHOS 87 72 65 61  BILITOT 1.9* 1.8* 1.1 0.6  PROT 6.9 5.8* 5.2* 5.3*  ALBUMIN 2.3* 1.9* 1.8* 1.7*   CBC:  Recent Labs Lab 12/18/15 1158 12/18/15 1404 12/19/15 0637 12/20/15 0512 12/21/15 0521 12/22/15 0445 12/23/15 0702  WBC 15.6* 14.1* 15.5* 8.1 7.2 9.2 8.5  NEUTROABS 12.6* 10.5*  --   --   --   --   --   HGB 12.8 11.2* 10.5* 9.4* 9.3* 9.1* 8.1*  HCT 40.9 36.3 34.0* 32.0* 29.7* 28.2* 25.7*  MCV 90.3 90.5 89.2 91.4 92.0 90.1 87.4  PLT 298 248 243 269 283 307 358   CBG:  Recent Labs Lab 12/20/15 1729 12/21/15 0741 12/22/15 0732 12/23/15 0747 12/24/15 0757  GLUCAP 96 119* 95 97 103*    Recent Results (from the past 240 hour(s))  Blood Culture (routine x 2)     Status: None   Collection Time: 12/18/15 11:36 AM  Result Value Ref Range Status   Specimen Description BLOOD RIGHT ANTECUBITAL  Final   Special Requests BOTTLES DRAWN AEROBIC AND ANAEROBIC 10CC  Final   Culture NO GROWTH 5 DAYS  Final   Report Status 12/23/2015 FINAL  Final  Blood Culture (routine x 2)     Status: None   Collection Time: 12/18/15 11:37 AM  Result Value Ref Range Status   Specimen  Description BLOOD LEFT ANTECUBITAL  Final   Special Requests BOTTLES DRAWN AEROBIC AND ANAEROBIC 5CC  Final   Culture NO GROWTH 5 DAYS  Final   Report Status 12/23/2015 FINAL  Final  Urine culture     Status: None   Collection Time: 12/18/15  1:35 PM  Result Value Ref Range Status   Specimen Description URINE, CATHETERIZED  Final   Special Requests NONE  Final   Culture 1,000 COLONIES/mL INSIGNIFICANT GROWTH  Final   Report Status 12/19/2015 FINAL  Final  MRSA PCR Screening     Status: None   Collection Time: 12/18/15  3:22 PM  Result Value Ref Range Status   MRSA by PCR NEGATIVE NEGATIVE Final  Urine culture     Status: None   Collection Time: 12/19/15  4:41 AM  Result Value Ref Range Status   Specimen Description URINE, CATHETERIZED  Final   Special Requests NONE  Final   Culture NO GROWTH 1 DAY  Final   Report Status 12/20/2015 FINAL  Final     Scheduled Meds: . aspirin EC  81 mg Oral Daily  . baclofen  10 mg Oral TID  . citalopram  10 mg Oral Daily  . divalproex  125 mg Oral Daily  . divalproex  500 mg Oral QHS  . docusate sodium  100 mg Oral BID  . enoxaparin injection  40 mg Subcutaneous Q24H  . fentaNYL  25 mcg Transdermal Q72H  . guaiFENesin  600 mg Oral BID  . Meropenem IV  1 g Intravenous Q8H  . oseltamivir  75 mg Oral BID  . oxyCODONE  5 mg Oral BID  . vancomycin  750 mg Intravenous Q12H   Continuous Infusions: . dextrose 5 % and 0.45% NaCl 100 mL/hr at 12/24/15 2130

## 2015-12-24 NOTE — Progress Notes (Signed)
Patient refused all her nighttime meds. Amayah Staheli, Drinda Butts, Charity fundraiser

## 2015-12-25 DIAGNOSIS — F4321 Adjustment disorder with depressed mood: Secondary | ICD-10-CM

## 2015-12-25 LAB — URINALYSIS, ROUTINE W REFLEX MICROSCOPIC
Bilirubin Urine: NEGATIVE
GLUCOSE, UA: NEGATIVE mg/dL
Hgb urine dipstick: NEGATIVE
KETONES UR: NEGATIVE mg/dL
LEUKOCYTES UA: NEGATIVE
Nitrite: NEGATIVE
PROTEIN: NEGATIVE mg/dL
Specific Gravity, Urine: 1.01 (ref 1.005–1.030)
pH: 7 (ref 5.0–8.0)

## 2015-12-25 LAB — MAGNESIUM: Magnesium: 1.9 mg/dL (ref 1.7–2.4)

## 2015-12-25 LAB — PROCALCITONIN

## 2015-12-25 LAB — BASIC METABOLIC PANEL
ANION GAP: 8 (ref 5–15)
CHLORIDE: 105 mmol/L (ref 101–111)
CO2: 25 mmol/L (ref 22–32)
Calcium: 7.5 mg/dL — ABNORMAL LOW (ref 8.9–10.3)
Creatinine, Ser: 0.3 mg/dL — ABNORMAL LOW (ref 0.44–1.00)
GFR calc Af Amer: >60 mL/min (ref >60)
GLUCOSE: 79 mg/dL (ref 65–99)
POTASSIUM: 2.6 mmol/L — AB (ref 3.5–5.1)
Sodium: 138 mmol/L (ref 135–145)

## 2015-12-25 LAB — CBC
HEMATOCRIT: 26.1 % — AB (ref 36.0–46.0)
HEMOGLOBIN: 8.5 g/dL — AB (ref 12.0–15.0)
MCH: 27.5 pg (ref 26.0–34.0)
MCHC: 32.6 g/dL (ref 30.0–36.0)
MCV: 84.5 fL (ref 78.0–100.0)
PLATELETS: 527 10*3/uL — AB (ref 150–400)
RBC: 3.09 MIL/uL — AB (ref 3.87–5.11)
RDW: 14.5 % (ref 11.5–15.5)
WBC: 9.3 10*3/uL (ref 4.0–10.5)

## 2015-12-25 LAB — LACTIC ACID, PLASMA: LACTIC ACID, VENOUS: 0.9 mmol/L (ref 0.5–2.0)

## 2015-12-25 LAB — GLUCOSE, CAPILLARY: Glucose-Capillary: 98 mg/dL (ref 65–99)

## 2015-12-25 MED ORDER — POTASSIUM CHLORIDE 10 MEQ/100ML IV SOLN
10.0000 meq | INTRAVENOUS | Status: AC
  Administered 2015-12-25 (×6): 10 meq via INTRAVENOUS
  Filled 2015-12-25 (×7): qty 100

## 2015-12-25 NOTE — Progress Notes (Addendum)
Patient ID: Sheena Porter, female   DOB: [birth year], 65 y.o.   MRN: [id number]  [facility] PROGRESS NOTE  Sheena Porter WUJ:[id number] DOB: 31-May-1951 DOA: 12/18/2015 PCP: Katy Apo, MD   Brief narrative:    65 y.o. female with known hx of MS, anemia, brought via EMS from [facility] rehabilitation due to fever and worsening sacral wounds, right lower leg wounds. The patient was receiving doxycycline 100 mg twice a day for 6 days and Invanz 1 g IV IM x 4 doses prior to presentation.  Assessment/Plan:    Acute respiratory failure with hypoxia due to Healthcare associated pneumonia, influenza PNA - Antibiotics vanc/meropenam. Day 8/8 of antibiotics. Pt still febrile with Tmax 101, unclear if related to flu itself or other infectious etiology still present  - Blood cultures - NGTD - urine culture insignificant growth - Oxygen supplementation and wean as able - Nebulizers with Xopenex every 6 hours prn - continue tamiflu  - will check lactic acid today as well as procalcitonin level, CT chest to rule out worsening PNA and UA   Fecal impaction - enema and disimpaction - bowel regimen  Sepsis due to RLL HCAP. Influenza A positive   - Patient meets sepsis criteria based on vital sign changes lactic acidosis and hypoxia.  - Patient's acute respiratory failure likely secondary to RLL pneumonia, Influenza A  - still with Tmax 101F, completed 8 days of ABX - continue Tamiflu to complete therapy  - leukocytosis resolved   Hypernatremia - likely due to dehydration - resolved  - diet advanced   Hypokalemia - still low, continue to supplement  - pt refusing PO, will change to IV  - BMP In AM  Hypomagnesemia - supplemented and WNL this AM  Pressure Ulcers - PT for hydrotherapy for the sacral wound to clean up a bit, may need NPWT VAC - needs low air loss mattress upon return to SNF  Transaminitis  - trending down   H/o MS - unable to give injection here as we do  not stock in pharmacy  Palliative care consult  -not ambulatory, not eating well - goals of care meeting.   Bed bound at SNF due to MS -  functional quadriplegia  DVT prophylaxis - Lovenox SQ  Code Status: Full Family Communication:  plan of care discussed with the patient Disposition Plan: SNF by 3/20  IV access:  Peripheral IV  Procedures and diagnostic studies:    Dg Chest Port 1 View 01-15-2016  Little change in asymmetric left lower lobe and right upper lobe airspace disease.   Dg Chest Port 1 View 12/19/2015 Stable left lower lobe consolidation. New mild patchy right upper parahilar opacities. Differential includes atelectasis and/or pneumonia.   Dg Chest Port 1 View 12/18/2015 Right lower lobe consolidation and air bronchograms, concerning for pneumonia. Followup PA and lateral chest X-ray is recommended in 3-4 weeks following trial of antibiotic therapy to ensure resolution and exclude underlying malignancy.   Dg Abd Portable 1v 12/19/2015  Large amount of stool is noted in the rectum concerning for impaction. No abnormal bowel dilatation is noted.  Medical Consultants:  WOC  Other Consultants:  PT  IAnti-Infectives:   Vancomycin 3/12 --> Meropenem 3/12 --> Tamiflu   Debbora Presto, MD  [facility] Pager [phone]  If 7PM-7AM, please contact night-coverage [email or web address] Password [email or web address] 12/25/2015, 11:10 AM   LOS: 7 days   HPI/Subjective: No events overnight.   Objective: Filed Vitals:   12/24/15 1607 12/24/15 2037 12/25/15 8657 12/25/15 8469  BP: 150/67 127/64 122/66 124/68  Pulse: 109 108 104 106  Temp: 99.2 F (37.3 C) 99.7 F (37.6 C) 100 F (37.8 C) 100.1 F (37.8 C)  TempSrc: Oral Oral Oral Oral  Resp: Height:      Weight:  81.6 kg (179 lb 14.3 oz)    SpO2: 100% 95% 91% 90%    Intake/Output Summary (Last 24 hours) at 12/25/15 1110 Last data filed at 12/25/15 1108  Gross per 24 hour  Intake    470 ml  Output   2350 ml  Net   -1880 ml    Exam:   General:  Pt is somnolent but easy to awake, appears ill, weak, minimally verbal   Cardiovascular: Regular rate and rhythm, no rubs, no gallops  Respiratory: Clear to auscultation bilaterally, rhonchi at bases with diminished breath sounds at bases   Abdomen: Soft, non tender, non distended, bowel sounds present, no guarding  Data Reviewed: Basic Metabolic Panel:  Recent Labs Lab 12/18/15 1404  12/20/15 0512 12/21/15 0521 12/22/15 0445 12/23/15 0702 12/25/15 0517  NA 152*  < > 155* 148* 145 142 138  K 3.8  < > 3.2* 3.5 3.1* 3.2* 2.6*  CL 112*  < > 120* 116* 114* 110 105  CO2 27  < > GLUCOSE 119*  < > 94 116* 98 101* 79  BUN 24*  < > <5* <5*  CREATININE 0.97  < > 0.53 0.67 0.51 0.49 0.30*  CALCIUM 7.9*  < > 8.1* 7.8* 7.9* 7.8* 7.5*  MG 2.0  --   --   --   --  1.6* 1.9  PHOS 3.7  --   --   --   --   --   --   < > = values in this interval not displayed. Liver Function Tests:  Recent Labs Lab 12/18/15 1158 12/18/15 1404 12/19/15 0637 12/21/15 0521  AST 134* 115* 98* 92*  ALT 35 ALKPHOS 87 72 65 61  BILITOT 1.9* 1.8* 1.1 0.6  PROT 6.9 5.8* 5.2* 5.3*  ALBUMIN 2.3* 1.9* 1.8* 1.7*   CBC:  Recent Labs Lab 12/18/15 1158 12/18/15 1404  12/20/15 0512 12/21/15 0521 12/22/15 0445 12/23/15 0702 12/25/15 0517  WBC 15.6* 14.1*  < > 8.1 7.2 9.2 8.5 9.3  NEUTROABS 12.6* 10.5*  --   --   --   --   --   --   HGB 12.8 11.2*  < > 9.4* 9.3* 9.1* 8.1* 8.5*  HCT 40.9 36.3  < > 32.0* 29.7* 28.2* 25.7* 26.1*  MCV 90.3 90.5  < > 91.4 92.0 90.1 87.4 84.5  PLT 298 248  < > 269 283 307 358 527*  < > = values in this interval not displayed. CBG:  Recent Labs Lab 12/21/15 0741 12/22/15 0732 12/23/15 0747 12/24/15 0757 12/25/15 0745  GLUCAP 119* 95 97 103* 98    Recent Results (from the past 240 hour(s))  Blood Culture (routine x 2)     Status: None   Collection Time: 12/18/15 11:36 AM  Result Value Ref Range  Status   Specimen Description BLOOD RIGHT ANTECUBITAL  Final   Special Requests BOTTLES DRAWN AEROBIC AND ANAEROBIC 10CC  Final   Culture NO GROWTH 5 DAYS  Final   Report Status 12/23/2015 FINAL  Final  Blood Culture (routine x 2)     Status: None   Collection Time:  12/18/15 11:37 AM  Result Value Ref Range Status   Specimen Description BLOOD LEFT ANTECUBITAL  Final   Special Requests BOTTLES DRAWN AEROBIC AND ANAEROBIC 5CC  Final   Culture NO GROWTH 5 DAYS  Final   Report Status 12/23/2015 FINAL  Final  Urine culture     Status: None   Collection Time: 12/18/15  1:35 PM  Result Value Ref Range Status   Specimen Description URINE, CATHETERIZED  Final   Special Requests NONE  Final   Culture 1,000 COLONIES/mL INSIGNIFICANT GROWTH  Final   Report Status 12/19/2015 FINAL  Final  MRSA PCR Screening     Status: None   Collection Time: 12/18/15  3:22 PM  Result Value Ref Range Status   MRSA by PCR NEGATIVE NEGATIVE Final  Urine culture     Status: None   Collection Time: 12/19/15  4:41 AM  Result Value Ref Range Status   Specimen Description URINE, CATHETERIZED  Final   Special Requests NONE  Final   Culture NO GROWTH 1 DAY  Final   Report Status 12/20/2015 FINAL  Final     Scheduled Meds: . aspirin EC  81 mg Oral Daily  . baclofen  10 mg Oral TID  . citalopram  10 mg Oral Daily  . divalproex  125 mg Oral Daily  . divalproex  500 mg Oral QHS  . docusate sodium  100 mg Oral BID  . enoxaparin injection  40 mg Subcutaneous Q24H  . fentaNYL  25 mcg Transdermal Q72H  . guaiFENesin  600 mg Oral BID  . Meropenem IV  1 g Intravenous Q8H  . oseltamivir  75 mg Oral BID  . oxyCODONE  5 mg Oral BID  . vancomycin  750 mg Intravenous Q12H   Continuous Infusions:

## 2015-12-25 NOTE — Progress Notes (Signed)
Pharmacy Antibiotic Note  Sheena Porter is a 65 y.o. female admitted on 12/18/2015 with sepsis due to an unknown source. The patient was on Doxy/Erta PTA at the NH and has been on Vancomycin + Azactam since admission. Given the patient's persistent fevers and recent hypotension - pharmacy has been consulted to broaden Azactam to Meropenem.   The patient is noted to be on depakote as a mood stabilizer. It is known that carbapenems can decrease VPA concentrations however since only on for mood instead of seizures - likely okay in the short term. Will monitor for any necessary changes.    Plan: 1. Meropenem 1g IV every 8 hours 2. Continue Vancomycin 750 mg IV every 12 hours 3. Will continue to follow renal function, culture results, LOT, and antibiotic de-escalation plans  4. MD plans to d/c antibiotics today 5. Oseltamivir 75 mg BID  Height: 5' (152.4 cm) Weight: 179 lb 14.3 oz (81.6 kg) IBW/kg (Calculated) : 45.5  Temp (24hrs), Avg:100.2 F (37.9 C), Min:99.2 F (37.3 C), Max:102.1 F (38.9 C)   Recent Labs Lab 12/18/15 1108  12/18/15 1404 12/18/15 1655  12/20/15 0512 12/20/15 1545 12/21/15 0521 12/21/15 1020 12/22/15 0445 12/23/15 0702 12/25/15 0517  WBC  --   < > 14.1*  --   < > 8.1  --  7.2  --  9.2 8.5 9.3  CREATININE  --   < > 0.97  --   < > 0.53  --  0.67  --  0.51 0.49 0.30*  LATICACIDVEN 1.99  --  1.4 1.7  --   --   --   --  2.0  --   --   --   VANCOTROUGH  --   --   --   --   --   --  18  --   --   --   --   --   < > = values in this interval not displayed.  Estimated Creatinine Clearance: 68.1 mL/min (by C-G formula based on Cr of 0.3).    Allergies  Allergen Reactions  . Penicillins Other (See Comments)  . Tape     Antimicrobials this admission: Doxy/Erta PTA (leg/sacral wound) 3/6 >> (intended to go for 10d) Vanc 3/12 >> (3/19) * 3/14 VT 18 mcg/ml on 750 mg/12h >> cont current dose Azactam 3/12 >> 3/15 Meropenem 3/15 >> (3/19) Tamiflu 3/15 >>  (3/20)  Dose adjustments this admission: * 3/14 VT 18 mcg/ml on 750 mg/12h >> cont current dose  Microbiology results: 3/12 BCx >> ngtd 3/12 UCx >> NGF 3/13 Ucx >>NGF 3/12 MRSA >> negative  Thank you for allowing pharmacy to be a part of this patient's care.  Hazle Nordmann, PharmD Pharmacy Resident [phone]

## 2015-12-26 ENCOUNTER — Inpatient Hospital Stay (HOSPITAL_COMMUNITY): Payer: Medicare Other

## 2015-12-26 DIAGNOSIS — Z7189 Other specified counseling: Secondary | ICD-10-CM

## 2015-12-26 DIAGNOSIS — R52 Pain, unspecified: Secondary | ICD-10-CM

## 2015-12-26 LAB — CBC
HEMATOCRIT: 25.2 % — AB (ref 36.0–46.0)
Hemoglobin: 8.3 g/dL — ABNORMAL LOW (ref 12.0–15.0)
MCH: 27.1 pg (ref 26.0–34.0)
MCHC: 32.9 g/dL (ref 30.0–36.0)
MCV: 82.4 fL (ref 78.0–100.0)
Platelets: 580 10*3/uL — ABNORMAL HIGH (ref 150–400)
RBC: 3.06 MIL/uL — ABNORMAL LOW (ref 3.87–5.11)
RDW: 14.4 % (ref 11.5–15.5)
WBC: 9.5 10*3/uL (ref 4.0–10.5)

## 2015-12-26 LAB — BASIC METABOLIC PANEL
Anion gap: 10 (ref 5–15)
BUN: <5 mg/dL — ABNORMAL LOW (ref 6–20)
CALCIUM: 7.4 mg/dL — AB (ref 8.9–10.3)
CO2: 23 mmol/L (ref 22–32)
CREATININE: 0.39 mg/dL — AB (ref 0.44–1.00)
Chloride: 106 mmol/L (ref 101–111)
GFR calc Af Amer: >60 mL/min (ref >60)
GFR calc non Af Amer: >60 mL/min (ref >60)
GLUCOSE: 54 mg/dL — AB (ref 65–99)
Potassium: 3.4 mmol/L — ABNORMAL LOW (ref 3.5–5.1)
Sodium: 139 mmol/L (ref 135–145)

## 2015-12-26 LAB — URINE CULTURE: Culture: 9000

## 2015-12-26 LAB — GLUCOSE, CAPILLARY
Glucose-Capillary: 66 mg/dL (ref 65–99)
Glucose-Capillary: 115 mg/dL — ABNORMAL HIGH (ref 65–99)

## 2015-12-26 MED ORDER — DEXTROSE 50 % IV SOLN
INTRAVENOUS | Status: AC
  Administered 2015-12-26: 50 mL
  Filled 2015-12-26: qty 50

## 2015-12-26 MED ORDER — POTASSIUM CHLORIDE CRYS ER 20 MEQ PO TBCR
40.0000 meq | EXTENDED_RELEASE_TABLET | Freq: Once | ORAL | Status: DC
  Filled 2015-12-26: qty 2

## 2015-12-26 NOTE — Consult Note (Signed)
WOC wound follow up Wound type: sacral wound, evaluation with PT during hydrotherapy.  Reviewed records, continue to treat treatable per family wishes  Measurement: see PT notes  Wound bed:80% pink, exposed bone/?tendon, 20% yellow/brown at wound edge at 3 o'clock  Drainage (amount, consistency, odor) moderate Periwound: intact  Dressing procedure/placement/frequency: Evaluation of wound today, less than 25% necrotic tissue present.  Wound is far enough from the anus that I feel a seal can be maintained with a NPWT VAC dressing.  I will stop hydrotherapy today and start NPWT tomorrow. I have discussed with the patient, no family in the room.  She is in agreement, my only fear is that she is not eating and certainly not eating enough protein and/or nutrients to heal this wound.  I have explained this to her.  I discussed with bedside nurse as well.  Bedside nurse reports she has been refusing meds and food. Would recommend low air loss mattress upon return to the SNF to offload the Stage 4 Pressure Injury.  Maximize nutrition, limit time up in a chair to less than one hour at a time.  WOC will follow up in the am for placement of NPWT VAC dressing.  Hosteen Kienast [place], [place] [phone]

## 2015-12-26 NOTE — Progress Notes (Signed)
Spoke w/ Dr Izola Price r/t pt refuses all meds and meals.

## 2015-12-26 NOTE — NC FL2 (Signed)
[place] MEDICAID FL2 LEVEL OF CARE SCREENING TOOL     IDENTIFICATION  Patient Name: Sheena Porter Birthdate: 1951-02-27 Sex: female Admission Date (Current Location): 12/18/2015  [facility] and [place] Number:  Producer, television/film/video and Address:  [facility] [place], [address], [place], [place] [address]      Provider Number: [id number]  Attending Physician Name and Address:  Dorothea Ogle, MD  Relative Name and Phone Number:       Current Level of Care: Hospital Recommended Level of Care: Skilled Nursing Facility Prior Approval Number:    Date Approved/Denied:   PASRR Number:    Discharge Plan: SNF    Current Diagnoses: Patient Active Problem List   Diagnosis Date Noted  . Adjustment disorder with depressed mood   . DNR (do not resuscitate) discussion   . Palliative care encounter   . HCAP (healthcare-associated pneumonia) 12/18/2015  . Sepsis due to pneumonia (HCC) 12/18/2015  . Multiple sclerosis (HCC) 12/18/2015  . Leukocytosis 12/18/2015  . 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)   PU Stage 2 Dressing:  (please see dc summary for wound care needs at time of discharge)   PU Stage 4 Dressing:  (please see discharge summary for wound care needs at time of discharge)               Personal Care Assistance Level of Assistance  Bathing, Dressing, Feeding Bathing Assistance: Maximum assistance Feeding assistance: Independent (supervision) Dressing Assistance: Maximum assistance     Functional Limitations Info             SPECIAL CARE FACTORS FREQUENCY  PT (By licensed PT)                     Contractures Contractures Info: Not present    Additional Factors Info  Allergies   Allergies Info: penicillins, tape           Current Medications (12/26/2015):  This is the current hospital active medication list Current Facility-Administered Medications  Medication Dose Route Frequency Provider Last Rate Last Dose  . acetaminophen (TYLENOL) tablet 650 mg  650 mg Oral Q6H PRN Marcos Eke, PA-C   650 mg at 12/21/15 0546   Or  . acetaminophen (TYLENOL) suppository 650 mg  650 mg Rectal Q6H PRN Marcos Eke, PA-C   650 mg at 12/20/15 0122  . aspirin EC tablet 81 mg  81 mg Oral Daily Marcos Eke, PA-C   81 mg at 12/24/15 1022  . baclofen (LIORESAL) tablet 10 mg  10 mg Oral TID Joseph Art, DO   10 mg at 12/25/15 1703  . chlorhexidine (PERIDEX) 0.12 % solution 10 mL  10 mL Mouth/Throat BID Jessica U Vann, DO   10 mL at 12/23/15 1019  . citalopram (CELEXA) tablet 10 mg  10 mg Oral Daily Canary Brim, NP   10 mg at 12/25/15 1219  . collagenase (SANTYL) ointment   Topical Daily Joseph Art, DO      . divalproex (DEPAKOTE SPRINKLE) capsule 125 mg  125 mg Oral Daily  Joseph Art, DO   125 mg at 12/25/15 1219  . divalproex (DEPAKOTE SPRINKLE) capsule 500 mg  500 mg Oral QHS Joseph Art, DO   500 mg at 12/25/15 2141  . docusate sodium (COLACE) capsule 100 mg  100 mg Oral BID Joseph Art, DO   100 mg at 12/24/15 1023  . enoxaparin (LOVENOX) injection 40 mg  40 mg Subcutaneous Q24H Marcos Eke, PA-C   40 mg at 12/22/15 1704  . feeding supplement (BOOST / RESOURCE BREEZE) liquid 1 Container  1 Container Oral BID BM Kathlen Mody, MD   1 Container at 12/24/15 1002  . feeding supplement (ENSURE ENLIVE) (ENSURE ENLIVE) liquid 237 mL  237 mL Oral BID BM Kathlen Mody, MD   237 mL at 12/23/15 1500  . feeding supplement (PRO-STAT SUGAR FREE 64) liquid 30 mL  30 mL Oral TID WC Jessica U Vann, DO   30 mL at 12/22/15 1154  . fentaNYL (DURAGESIC - dosed mcg/hr) patch 25 mcg  25 mcg  Transdermal Q72H Joseph Art, DO   25 mcg at 12/26/15 0827  . guaiFENesin (MUCINEX) 12 hr tablet 600 mg  600 mg Oral BID Marcos Eke, PA-C   600 mg at 12/25/15 1215  . HYDROcodone-acetaminophen (NORCO/VICODIN) 5-325 MG per tablet 1-2 tablet  1-2 tablet Oral Q4H PRN Marcos Eke, PA-C      . levalbuterol Pauline Aus) nebulizer solution 1.25 mg  1.25 mg Nebulization Q6H PRN Marcos Eke, PA-C      . ondansetron [facility]) tablet 4 mg  4 mg Oral Q6H PRN Marcos Eke, PA-C       Or  . ondansetron [facility]) injection 4 mg  4 mg Intravenous Q6H PRN Marcos Eke, PA-C   4 mg at 12/19/15 0143  . oseltamivir (TAMIFLU) capsule 75 mg  75 mg Oral BID Leda Gauze, NP   75 mg at 12/25/15 1216  . oxyCODONE (Oxy IR/ROXICODONE) immediate release tablet 5 mg  5 mg Oral Q6H PRN Joseph Art, DO   5 mg at 12/23/15 2310  . oxyCODONE (Oxy IR/ROXICODONE) immediate release tablet 5 mg  5 mg Oral BID Joseph Art, DO   5 mg at 12/25/15 1217  . potassium chloride 10 mEq in 100 mL IVPB  10 mEq Intravenous Once Joseph Art, DO   10 mEq at 12/20/15 2136  . potassium chloride SA (K-DUR,KLOR-CON) CR tablet 40 mEq  40 mEq Oral Once Dorothea Ogle, MD   40 mEq at 12/26/15 1130  . saccharomyces boulardii (FLORASTOR) capsule 250 mg  250 mg Oral Daily Joseph Art, DO   250 mg at 12/23/15 1019  . senna-docusate (Senokot-S) tablet 1 tablet  1 tablet Oral QHS PRN Marcos Eke, PA-C         Discharge Medications: Please see discharge summary for a list of discharge medications.  Relevant Imaging Results:  Relevant Lab Results:   Additional Information    Ernestine Mcmurray, [place] , LCSW

## 2015-12-26 NOTE — Clinical Social Work Note (Signed)
Clinical Social Work Assessment  Patient Details  Name: Sheena Porter MRN: [id number] Date of Birth: 30-Jan-1951  Date of referral:  12/26/15               Reason for consult:  Facility Placement                Permission sought to share information with:  Facility Industrial/product designer granted to share information::  No (patient has demetia and is only oriented to self at this time)  Name::        Agency::   [facility])  Relationship::   (Son, Sheena Porter)  Solicitor Information:     Housing/Transportation Living arrangements for the past 2 months:  Skilled Building surveyor of Information:  Adult Children Patient Interpreter Needed:  None Criminal Activity/Legal Involvement Pertinent to Current Situation/Hospitalization:    Significant Relationships:  Friend Lives with:  Facility Resident Do you feel safe going back to the place where you live?  Yes Need for family participation in patient care:  Yes (Comment) (patient disoriented)  Care giving concerns:  Son voiced no caregiving concerns at this time.   Social Worker assessment / plan:  CSW spoke with son Sheena Porter to complete assessment as patient is alert though not fully oriented.  Patient is a LTC resident at [facility] and the plan is for the patient to return (projected tomorrow 3/21) via [facility] once medically cleared.  Patient's son Sheena Porter states he is his mother's main caregiver.  Employment status:  Retired Database administrator PT Recommendations:  Skilled Nursing Facility Information / Referral to community resources:  Skilled Nursing Facility  Patient/Family's Response to care:  Unable to assess patient's response.  Sheena Porter is agreeable for patient to return to SNF.  Patient/Family's Understanding of and Emotional Response to Diagnosis, Current Treatment, and Prognosis:  Sheena Porter expressed understanding regarding her level of care needed at time of discharge.  His mother's dx  of flu and pneumonia have him worried especially regarding her return.  CSW encouraged son to speak with physician to obtain more clarity on patient's condition closer to discharge.  Son agreeable.  Emotional Assessment Appearance:  Appears stated age Attitude/Demeanor/Rapport:    Affect (typically observed):  Unable to Assess Orientation:  Oriented to Self Alcohol / Substance use:  Not Applicable Psych involvement (Current and /or in the community):  No (Comment)  Discharge Needs  Concerns to be addressed:  No discharge needs identified Readmission within the last 30 days:  No Current discharge risk:  None Barriers to Discharge:  Continued Medical Work up   [employer], LCSW 12/26/2015, 2:10 PM

## 2015-12-26 NOTE — Progress Notes (Signed)
Speech Language Pathology Treatment: Dysphagia  Patient Details Name: Mirage Pfefferkorn MRN: [id number] DOB: [birth year] Today's Date: 12/26/2015 Time: 4098-1191 SLP Time Calculation (min) (ACUTE ONLY): 14 min  Assessment / Plan / Recommendation Clinical Impression  Session focused on addressing diagnostic treatment of swallow function.  Per chart review patient with worsening chest x-ray and only consuming small amounts of thin liquids.  Patient declined all PO except for 2 sips of water via straw which resulted in oral holding and multiple swallows with no overt s/s of aspiration.  Patient remains full code and and objective assessment would be recommended if participation would allow.  Recommend to continue with current plan of care and close SLP monitoring.      HPI HPI: 65 year old female admitted 12/18/15 due to significant decline in mental status intake and activity. Pt with acute repiratory failure, anemia, sacral and RLE wounds, and MS.       SLP Plan  Continue with current plan of care     Recommendations  Diet recommendations: Thin liquid Liquids provided via: Cup;Straw Medication Administration: Crushed with puree Supervision: Full supervision/cueing for compensatory strategies;Patient able to self feed;Staff to assist with self feeding Compensations: Minimize environmental distractions;Slow rate;Small sips/bites Postural Changes and/or Swallow Maneuvers: Seated upright 90 degrees;Upright 30-60 min after meal             Oral Care Recommendations: Oral care QID Follow up Recommendations: Other (comment) (TBD) Plan: Continue with current plan of care     GO               Charlane Ferretti., CCC-SLP [phone]   Cassi Jenne 12/26/2015, 3:37 PM

## 2015-12-26 NOTE — Progress Notes (Signed)
Patient ID: Sheena Porter, female   DOB: 1951/09/01, 65 y.o.   MRN: [id number]  [facility] PROGRESS NOTE  Sheena Porter Knife WUJ:[id number] DOB: [birth year] DOA: 12/18/2015 PCP: Katy Apo, MD   Brief narrative:    65 y.o. female with known hx of MS, anemia, brought via EMS from [facility] rehabilitation due to fever and worsening sacral wounds, right lower leg wounds. The patient was receiving doxycycline 100 mg twice a day for 6 days and Invanz 1 g IV IM x 4 doses prior to presentation.  Assessment/Plan:    Acute respiratory failure with hypoxia due to Healthcare associated pneumonia, influenza PNA - Antibiotics vanc/meropenam. Day 8/8 of antibiotics.  - no fevers in the past 24 hours, will stop all ABX, pt also completed tamiflu  - Blood cultures - NGTD - urine culture insignificant growth - Oxygen supplementation and wean as able - Nebulizers with Xopenex every 6 hours prn - if no further fevers off all ABX and off tamiflu, can plan to d/c SNF in AM  Fecal impaction - enema and disimpaction - bowel regimen  Sepsis due to RLL and LLL HCAP. Influenza A positive   - Patient meets sepsis criteria based on vital sign changes lactic acidosis and hypoxia.  - Patient's acute respiratory failure likely secondary to RLL and LLL pneumonia, Influenza A  - completed 8 days of ABX and Tamiflu  - leukocytosis resolved, procalcitonin and lactic acid WNL  Hypernatremia - likely due to dehydration - resolved  - diet advanced   Hypokalemia - still low, continue to supplement  - pt refusing PO - BMP In AM  Hypomagnesemia - supplemented and WNL 3/19  Pressure Ulcers - PT for hydrotherapy for the sacral wound to clean up a bit, may need NPWT VAC - needs low air loss mattress upon return to SNF  Transaminitis  - trending down   H/o MS - unable to give injection here as we do not stock in pharmacy  Palliative care consult  -not ambulatory, not eating well - goals of  care meeting.   Bed bound at SNF due to MS -  functional quadriplegia  DVT prophylaxis - Lovenox SQ  Code Status: Full Family Communication:  plan of care discussed with the patient Disposition Plan: SNF by 3/21 if no further fevers   IV access:  Peripheral IV  Procedures and diagnostic studies:    Dg Chest Port 1 View 2015-12-24  Little change in asymmetric left lower lobe and right upper lobe airspace disease.   Dg Chest Port 1 View 12/19/2015 Stable left lower lobe consolidation. New mild patchy right upper parahilar opacities. Differential includes atelectasis and/or pneumonia.   Dg Chest Port 1 View 12/18/2015 Right lower lobe consolidation and air bronchograms, concerning for pneumonia. Followup PA and lateral chest X-ray is recommended in 3-4 weeks following trial of antibiotic therapy to ensure resolution and exclude underlying malignancy.   Dg Abd Portable 1v 12/19/2015  Large amount of stool is noted in the rectum concerning for impaction. No abnormal bowel dilatation is noted.  Medical Consultants:  WOC  Other Consultants:  PT  IAnti-Infectives:   Vancomycin 3/12 --> Meropenem 3/12 --> Tamiflu   Debbora Presto, MD  [facility] Pager [phone]  If 7PM-7AM, please contact night-coverage [email or web address] Password [email or web address] 12/26/2015, 10:58 AM   LOS: 8 days   HPI/Subjective: No events overnight.   Objective: Filed Vitals:   12/25/15 1748 12/25/15 1935 12/26/15 0503 12/26/15 0905  BP: 151/108 147/75 107/45 137/66  Pulse: 112 98  98 98  Temp: 98.7 F (37.1 C)  98.3 F (36.8 C) 98.2 F (36.8 C)  TempSrc: Oral Oral Oral Oral  Resp: 20 18 18 18   Height:      Weight:  81.4 kg (179 lb 7.3 oz)    SpO2: 96% 97% 96% 98%    Intake/Output Summary (Last 24 hours) at 12/26/15 1058 Last data filed at 12/26/15 3009  Gross per 24 hour  Intake    420 ml  Output   1700 ml  Net  -1280 ml    Exam:   General:  Pt is somnolent but easy to awake, appears ill, weak,  minimally verbal   Cardiovascular: Regular rate and rhythm, no rubs, no gallops  Respiratory: Clear to auscultation bilaterally, rhonchi at bases with diminished breath sounds at bases   Abdomen: Soft, non tender, non distended, bowel sounds present, no guarding  Data Reviewed: Basic Metabolic Panel:  Recent Labs Lab 12/21/15 0521 12/22/15 0445 12/23/15 0702 12/25/15 0517 12/26/15 0717  NA 148* 145 142 138 139  K 3.5 3.1* 3.2* 2.6* 3.4*  CL 116* 114* 110 105 106  CO2 25 22 22 25 23   GLUCOSE 116* 98 101* 79 54*  BUN 10 7 <5* <5* <5*  CREATININE 0.67 0.51 0.49 0.30* 0.39*  CALCIUM 7.8* 7.9* 7.8* 7.5* 7.4*  MG  --   --  1.6* 1.9  --    Liver Function Tests:  Recent Labs Lab 12/21/15 0521  AST 92*  ALT 26  ALKPHOS 61  BILITOT 0.6  PROT 5.3*  ALBUMIN 1.7*   CBC:  Recent Labs Lab 12/21/15 0521 12/22/15 0445 12/23/15 0702 12/25/15 0517 12/26/15 0717  WBC 7.2 9.2 8.5 9.3 9.5  HGB 9.3* 9.1* 8.1* 8.5* 8.3*  HCT 29.7* 28.2* 25.7* 26.1* 25.2*  MCV 92.0 90.1 87.4 84.5 82.4  PLT 283 307 358 527* 580*   CBG:  Recent Labs Lab 12/23/15 0747 12/24/15 0757 12/25/15 0745 12/26/15 0737 12/26/15 0904  GLUCAP 97 103* 98 66 115*    Recent Results (from the past 240 hour(s))  Blood Culture (routine x 2)     Status: None   Collection Time: 12/18/15 11:36 AM  Result Value Ref Range Status   Specimen Description BLOOD RIGHT ANTECUBITAL  Final   Special Requests BOTTLES DRAWN AEROBIC AND ANAEROBIC 10CC  Final   Culture NO GROWTH 5 DAYS  Final   Report Status 12/23/2015 FINAL  Final  Blood Culture (routine x 2)     Status: None   Collection Time: 12/18/15 11:37 AM  Result Value Ref Range Status   Specimen Description BLOOD LEFT ANTECUBITAL  Final   Special Requests BOTTLES DRAWN AEROBIC AND ANAEROBIC 5CC  Final   Culture NO GROWTH 5 DAYS  Final   Report Status 12/23/2015 FINAL  Final  Urine culture     Status: None   Collection Time: 12/18/15  1:35 PM  Result  Value Ref Range Status   Specimen Description URINE, CATHETERIZED  Final   Special Requests NONE  Final   Culture 1,000 COLONIES/mL INSIGNIFICANT GROWTH  Final   Report Status 12/19/2015 FINAL  Final  MRSA PCR Screening     Status: None   Collection Time: 12/18/15  3:22 PM  Result Value Ref Range Status   MRSA by PCR NEGATIVE NEGATIVE Final  Urine culture     Status: None   Collection Time: 12/19/15  4:41 AM  Result Value Ref Range Status   Specimen Description URINE, CATHETERIZED  Final   Special Requests NONE  Final   Culture NO GROWTH 1 DAY  Final   Report Status 12/20/2015 FINAL  Final     Scheduled Meds: . aspirin EC  81 mg Oral Daily  . baclofen  10 mg Oral TID  . citalopram  10 mg Oral Daily  . divalproex  125 mg Oral Daily  . divalproex  500 mg Oral QHS  . docusate sodium  100 mg Oral BID  . enoxaparin injection  40 mg Subcutaneous Q24H  . fentaNYL  25 mcg Transdermal Q72H  . guaiFENesin  600 mg Oral BID  . Meropenem IV  1 g Intravenous Q8H  . oseltamivir  75 mg Oral BID  . oxyCODONE  5 mg Oral BID  . vancomycin  750 mg Intravenous Q12H   Continuous Infusions:

## 2015-12-26 NOTE — Progress Notes (Signed)
Physical Therapy Wound Treatment Patient Details  Name: Sheena Porter MRN: [id number] Date of Birth: 1951/02/23  Today's Date: 12/26/2015 Time: 7591-6384 Time Calculation (min): 26 min  Subjective  Subjective: No new complaints. Pt reports pain with hydrotherapy treatment Patient and Family Stated Goals: Pt unable to participate in goal setting Date of Onset:  (Prior to admission)  Pain Score:  Pt reports increased pain/discomfort during treatment today.  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 (Active)  Dressing Type Gauze (Comment);Moist to dry;Foam 12/26/2015 12:00 PM  Dressing Clean;Dry;Intact 12/26/2015 12:00 PM  Dressing Change Frequency Daily 12/26/2015 12:00 PM  State of Healing Eschar 12/26/2015 12:00 PM  Site / Wound Assessment Pink;Black;Yellow 12/26/2015 12:00 PM  % Wound base Red or Granulating 75% 12/26/2015 12:00 PM  % Wound base Yellow 20% 12/26/2015 12:00 PM  % Wound base Black 5% 12/26/2015 12:00 PM  Peri-wound Assessment Intact 12/26/2015 12:00 PM  Wound Length (cm) 6.5 cm 12/20/2015 10:37 AM  Wound Width (cm) 10 cm 12/20/2015 10:37 AM  Wound Depth (cm) 2.5 cm 12/20/2015 10:37 AM  Undermining (cm) 2 12/20/2015 10:37 AM  Margins Unattached edges (unapproximated) 12/26/2015 12:00 PM  Drainage Amount Minimal 12/26/2015 12:00 PM  Drainage Description Serosanguineous 12/26/2015 12:00 PM  Treatment Debridement (Selective);Hydrotherapy (Pulse lavage);Packing (Saline gauze) 12/26/2015 12:00 PM   Hydrotherapy Pulsed lavage therapy - wound location: Sacrum Pulsed Lavage with Suction (psi): 8 psi Pulsed Lavage with Suction - Normal Saline Used: 1000 mL Pulsed Lavage Tip: Tip with splash shield Selective Debridement Selective Debridement - Location: Sacrum Selective Debridement - Tools Used: Forceps;Scissors Selective Debridement - Tissue Removed: brown and yellow necrotic tissue   Wound Assessment and  Plan  Wound Therapy - Assess/Plan/Recommendations Wound Therapy - Clinical Statement: Progressing with selective removal of non-viable tissue Wound Therapy - Functional Problem List: Decreased mobility and OOB due to pressure injury. Factors Delaying/Impairing Wound Healing: Immobility;Multiple medical problems Hydrotherapy Plan: Debridement;Dressing change;Patient/family education;Pulsatile lavage with suction Wound Therapy - Frequency: 6X / week Wound Therapy - Follow Up Recommendations: Skilled nursing facility Wound Plan: See above  Wound Therapy Goals- Improve the function of patient's integumentary system by progressing the wound(s) through the phases of wound healing (inflammation - proliferation - remodeling) by: Decrease Necrotic Tissue to: 20% Decrease Necrotic Tissue - Progress: Progressing toward goal Increase Granulation Tissue to: 80% Increase Granulation Tissue - Progress: Progressing toward goal Goals/treatment plan/discharge plan were made with and agreed upon by patient/family: No, Patient unable to participate in goals/treatment/discharge plan and family unavailable Time For Goal Achievement: 7 days Wound Therapy - Potential for Goals: Good  Goals will be updated until maximal potential achieved or discharge criteria met.  Discharge criteria: when goals achieved, discharge from hospital, MD decision/surgical intervention, no progress towards goals, refusal/missing three consecutive treatments without notification or medical reason.  GP     Rolinda Roan 12/26/2015, 12:17 PM   Rolinda Roan, PT, DPT Acute Rehabilitation Services Pager: [phone]

## 2015-12-26 NOTE — Progress Notes (Signed)
Daily Progress Note   Patient Name: Sheena Porter       Date: 12/26/2015 DOB: 14-Jun-1951  Age: 65 y.o. MRN#: [id number] Attending Physician: Dorothea Ogle, MD Primary Care Physician: Katy Apo, MD Admit Date: 12/18/2015  Reason for Consultation/Follow-up: Establishing goals of care, Non pain symptom management and Psychosocial/spiritual support  Subjective:  -little improvement, continues with very little po intake   Continued conversation with patient's brother and son today regarding long term poor prognosis.  We discussed the natural trajectory of MS and most importantly the values and goals of care important to this patient and her family.  -detailed an aggressive medical intervention path vs a supportive comfort path   Length of Stay: 8 days  Current Medications: Scheduled Meds:  . aspirin EC  81 mg Oral Daily  . baclofen  10 mg Oral TID  . chlorhexidine  10 mL Mouth/Throat BID  . citalopram  10 mg Oral Daily  . collagenase   Topical Daily  . divalproex  125 mg Oral Daily  . divalproex  500 mg Oral QHS  . docusate sodium  100 mg Oral BID  . enoxaparin (LOVENOX) injection  40 mg Subcutaneous Q24H  . feeding supplement  1 Container Oral BID BM  . feeding supplement (ENSURE ENLIVE)  237 mL Oral BID BM  . feeding supplement (PRO-STAT SUGAR FREE 64)  30 mL Oral TID WC  . fentaNYL  25 mcg Transdermal Q72H  . guaiFENesin  600 mg Oral BID  . oseltamivir  75 mg Oral BID  . oxyCODONE  5 mg Oral BID  . potassium chloride  10 mEq Intravenous Once  . potassium chloride  40 mEq Oral Once  . saccharomyces boulardii  250 mg Oral Daily    Continuous Infusions:    PRN Meds: acetaminophen **OR** acetaminophen, HYDROcodone-acetaminophen, levalbuterol, ondansetron **OR**  ondansetron (ZOFRAN) IV, oxyCODONE, senna-docusate  Physical Exam: Physical Exam  Constitutional: She appears listless. She appears cachectic. She appears ill.  Pulmonary/Chest: She has decreased breath sounds in the right lower field and the left lower field.  Abdominal: Soft. Normal appearance. There is no tenderness.  Neurological: She appears listless.  Skin: Skin is warm and dry.                Vital Signs: BP 137/66 mmHg  Pulse  98  Temp(Src) 98.2 F (36.8 C) (Oral)  Resp 18  Ht 5' (1.524 m)  Wt 81.4 kg (179 lb 7.3 oz)  BMI 35.05 kg/m2  SpO2 98%  LMP  (LMP Unknown) SpO2: SpO2: 98 % O2 Device: O2 Device: Not Delivered O2 Flow Rate: O2 Flow Rate (L/min): 8 L/min  Intake/output summary:  Intake/Output Summary (Last 24 hours) at 12/26/15 1550 Last data filed at 12/26/15 1610  Gross per 24 hour  Intake    360 ml  Output   1400 ml  Net  -1040 ml   LBM: Last BM Date: 12/22/15 Baseline Weight: Weight: 81.194 kg (179 lb) Most recent weight: Weight: 81.4 kg (179 lb 7.3 oz)       Palliative Assessment/Data: Flowsheet Rows        Most Recent Value   Intake Tab    Referral Department  Hospitalist   Unit at Time of Referral  Med/Surg Unit   Palliative Care Primary Diagnosis  Sepsis/Infectious Disease   Date Notified  12/21/15   Palliative Care Type  New Palliative care   Reason for referral  Clarify Goals of Care   Date of Admission  12/18/15   Date first seen by Palliative Care  12/22/15   # of days Palliative referral response time  1 Day(s)   # of days IP prior to Palliative referral  3   Clinical Assessment    Psychosocial & Spiritual Assessment    Palliative Care Outcomes       Additional Data Reviewed: CBC    Component Value Date/Time   WBC 9.5 12/26/2015 0717   RBC 3.06* 12/26/2015 0717   HGB 8.3* 12/26/2015 0717   HCT 25.2* 12/26/2015 0717   PLT 580* 12/26/2015 0717   MCV 82.4 12/26/2015 0717   MCH 27.1 12/26/2015 0717   MCHC 32.9 12/26/2015 0717    RDW 14.4 12/26/2015 0717   LYMPHSABS 2.4 12/18/2015 1404   MONOABS 1.2* 12/18/2015 1404   EOSABS 0.0 12/18/2015 1404   BASOSABS 0.0 12/18/2015 1404    CMP     Component Value Date/Time   NA 139 12/26/2015 0717   K 3.4* 12/26/2015 0717   CL 106 12/26/2015 0717   CO2 23 12/26/2015 0717   GLUCOSE 54* 12/26/2015 0717   BUN <5* 12/26/2015 0717   CREATININE 0.39* 12/26/2015 0717   CALCIUM 7.4* 12/26/2015 0717   PROT 5.3* 12/21/2015 0521   ALBUMIN 1.7* 12/21/2015 0521   AST 92* 12/21/2015 0521   ALT 26 12/21/2015 0521   ALKPHOS 61 12/21/2015 0521   BILITOT 0.6 12/21/2015 0521   GFRNONAA >60 12/26/2015 0717   GFRAA >60 12/26/2015 0717       Problem List:  Patient Active Problem List   Diagnosis Date Noted  . Adjustment disorder with depressed mood   . DNR (do not resuscitate) discussion   . Palliative care encounter   . HCAP (healthcare-associated pneumonia) 12/18/2015  . Sepsis due to pneumonia (HCC) 12/18/2015  . Multiple sclerosis (HCC) 12/18/2015  . Leukocytosis 12/18/2015  . Pressure ulcer 12/18/2015     Palliative Care Assessment & Plan    1.Code Status:  Full code- strongly encouraged to consider DNR status understanding poor outcome in similar pateitns    Code Status Orders        Start     Ordered   12/18/15 1351  Full code   Continuous     12/18/15 1401    Code Status History    Date Active Date  Inactive Code Status Order ID Comments User Context   This patient has a current code status but no historical code status.       Goals of Care/Additional Recommendations:  At this time goal is to treat the treatable, family has begun conversation regarding GOC.  No decisions att his time regarding any shift for a comfort focus.  Family is open to continued education and conversation.  Recommend Palliative services on dc to SNF.    Psycho-social Needs: Education on Hospice  Symptom Management:       1. Pain (genrealized and specific to would care):   Frequent pain assessment and prophylactic pain management prior to wound care treatments)       2.  Depression vs Adjustment disorder: Continue Celexa, monitor for improved symptoms over the next several weeks   4. Palliative Prophylaxis:   Aspiration, Bowel Regimen, Delirium Protocol, Frequent Pain Assessment and Oral Care  5. Prognosis: Unable to determine  6. Discharge Planning:   Back to SNF with palliative vs hospice    Thank you for allowing the Palliative Medicine Team to assist in the care of this patient.   Time In: 1500 Time Out: 1600 Total Time 60 min Prolonged Time Billed  no         Canary Brim, NP  12/26/2015, 3:50 PM  Please contact Palliative Medicine Team phone at [phone] for questions and concerns.

## 2015-12-27 DIAGNOSIS — R52 Pain, unspecified: Secondary | ICD-10-CM | POA: Diagnosis present

## 2015-12-27 LAB — BASIC METABOLIC PANEL
Anion gap: 11 (ref 5–15)
CHLORIDE: 105 mmol/L (ref 101–111)
CO2: 24 mmol/L (ref 22–32)
CREATININE: 0.49 mg/dL (ref 0.44–1.00)
Calcium: 7.7 mg/dL — ABNORMAL LOW (ref 8.9–10.3)
GFR calc Af Amer: >60 mL/min (ref >60)
GFR calc non Af Amer: >60 mL/min (ref >60)
GLUCOSE: 59 mg/dL — AB (ref 65–99)
POTASSIUM: 2.9 mmol/L — AB (ref 3.5–5.1)
SODIUM: 140 mmol/L (ref 135–145)

## 2015-12-27 LAB — GLUCOSE, CAPILLARY
GLUCOSE-CAPILLARY: 51 mg/dL — AB (ref 65–99)
Glucose-Capillary: 164 mg/dL — ABNORMAL HIGH (ref 65–99)

## 2015-12-27 LAB — CBC
HEMATOCRIT: 25.9 % — AB (ref 36.0–46.0)
HEMOGLOBIN: 8.7 g/dL — AB (ref 12.0–15.0)
MCH: 28.5 pg (ref 26.0–34.0)
MCHC: 33.6 g/dL (ref 30.0–36.0)
MCV: 84.9 fL (ref 78.0–100.0)
Platelets: 609 10*3/uL — ABNORMAL HIGH (ref 150–400)
RBC: 3.05 MIL/uL — AB (ref 3.87–5.11)
RDW: 14.8 % (ref 11.5–15.5)
WBC: 9 10*3/uL (ref 4.0–10.5)

## 2015-12-27 LAB — PROCALCITONIN: Procalcitonin: <0.1 ng/mL

## 2015-12-27 MED ORDER — DEXTROSE 50 % IV SOLN
INTRAVENOUS | Status: AC
  Administered 2015-12-27: 50 mL
  Filled 2015-12-27: qty 50

## 2015-12-27 NOTE — Clinical Social Work Note (Signed)
Patient will not discharge back to [facility] today (12-27-15) due to not being medically ready. Patient needs a wound vac, but will not communicate with MD (3:02 pm). MD stated she was still trying to reach the patient's brother to discuss the wound vac, but had not had any success so far. CSW will follow.

## 2015-12-27 NOTE — Discharge Summary (Addendum)
Physician Discharge Summary  Sheena Porter SUP:[id number] DOB: 1951/04/23 DOA: 12/18/2015  PCP: Katy Apo, MD  Admit date: 12/18/2015 Discharge date: 12/28/2015  Recommendations for Outpatient Follow-up:  1. Pt will need to follow up with PCP in 2-3 weeks post discharge 2. Please obtain BMP to evaluate electrolytes and kidney function, K level  3. Please also check CBC to evaluate Hg and Hct levels 4. WOC discussed placement of NPWT VAC therapy to the sacral wound, however, nutrition is sub optimal for VAC therapy at this time, she will need to have her nutrition be a focus along with the [facility] therapy to have any chance of success with the therapy. Would suggest SNF to continue enzymatic debridement ointment until family and patient make further decisions and nutrition has been optimized.  5. Will need low air loss for pressure redistribution upon return to SNF.   Discharge Diagnoses:  Principal Problem:   Sepsis due to pneumonia [facility]) Active Problems:   HCAP (healthcare-associated pneumonia)   Multiple sclerosis (HCC)   Leukocytosis   Pressure ulcer   Adjustment disorder with depressed mood   DNR (do not resuscitate) discussion   Palliative care encounter   Pain  Discharge Condition: Stable  Diet recommendation: Heart healthy diet discussed in details    Brief narrative:    65 y.o. female with known hx of MS, anemia, brought via EMS from [facility] rehabilitation due to fever and worsening sacral wounds, right lower leg wounds. The patient was receiving doxycycline 100 mg twice a day for 6 days and Invanz 1 g IV IM x 4 doses prior to presentation.  Assessment/Plan:    Acute respiratory failure with hypoxia due to Healthcare associated pneumonia, influenza PNA - Antibiotics vanc/meropenam. Day 8/8 of antibiotics.  - no fevers in the past 72 hours, completed course of Tamiflu and ABX  - Blood cultures - NGTD - urine culture insignificant growth - Oxygen  supplementation and wean as able  Fecal impaction - enema and disimpaction - bowel regimen  Sepsis due to RLL and LLL HCAP. Influenza A positive  - Patient meets sepsis criteria based on vital sign changes lactic acidosis and hypoxia.  - Patient's acute respiratory failure likely secondary to RLL and LLL pneumonia, Influenza A  - completed 8 days of ABX and Tamiflu  - leukocytosis resolved, procalcitonin and lactic acid WNL  Hypernatremia - likely due to dehydration - resolved   Hypokalemia - still low, continue to supplement   Hypomagnesemia - supplemented   Pressure Ulcers - PT for hydrotherapy for the sacral wound to clean up a bit, may need NPWT VAC, please see recommendations above under FOLLOW UP INSTRUCTIONS  - needs low air loss mattress upon return to SNF  Transaminitis  - trending down   H/o MS - unable to give injection here as we do not stock in pharmacy  Palliative care consult -not ambulatory, not eating well - goals of care meeting.   Bed bound at SNF due to MS - functional quadriplegia  DVT prophylaxis - Lovenox SQ  Code Status: Full Family Communication: plan of care discussed with the patient Disposition Plan: SNF by 3/22  IV access:  Peripheral IV  Procedures and diagnostic studies:   Dg Chest Port 1 View 12/21/2015 Little change in asymmetric left lower lobe and right upper lobe airspace disease.   Dg Chest Port 1 View 12/19/2015 Stable left lower lobe consolidation. New mild patchy right upper parahilar opacities. Differential includes atelectasis and/or pneumonia.   Dg Chest  Port 1 View 12/18/2015 Right lower lobe consolidation and air bronchograms, concerning for pneumonia. Followup PA and lateral chest X-ray is recommended in 3-4 weeks following trial of antibiotic therapy to ensure resolution and exclude underlying malignancy.   Dg Abd Portable 1v 12/19/2015 Large amount of stool is noted in the rectum concerning for  impaction. No abnormal bowel dilatation is noted.  Medical Consultants:  WOC  Other Consultants:  PT  IAnti-Infectives: Completed   Vancomycin  Meropenem  Tamiflu   Debbora Presto, MD [facility] Pager [phone]     Discharge Exam: Filed Vitals:   12/27/15 0748 12/27/15 1754  BP: 136/79 136/77  Pulse: 103 102  Temp: 98.2 F (36.8 C) 98.2 F (36.8 C)  Resp: 18 18   Filed Vitals:   12/26/15 2035 12/27/15 0420 12/27/15 0748 12/27/15 1754  BP: 105/70 120/52 136/79 136/77  Pulse: 109 97 103 102  Temp: 98.6 F (37 C) 98.3 F (36.8 C) 98.2 F (36.8 C) 98.2 F (36.8 C)  TempSrc: Oral Oral Oral Oral  Resp: 18 18 18 18   Height:      Weight: 76.068 kg (167 lb 11.2 oz)     SpO2: 97% 96% 97% 97%    General: Pt is alert, follows commands appropriately, not in acute distress Cardiovascular: Regular rate and rhythm, S1/S2 +, no murmurs, no rubs, no gallops Respiratory: Clear to auscultation bilaterally, no wheezing, diminished breath sounds at bases  Abdominal: Soft, non tender, non distended, bowel sounds +, no guarding  Discharge Instructions     Medication List    STOP taking these medications        doxycycline 100 MG capsule  Commonly known as:  VIBRAMYCIN     ertapenem 1 g injection  Commonly known as:  INVANZ     saccharomyces boulardii 250 MG capsule  Commonly known as:  FLORASTOR      TAKE these medications        Arginine Powd  Take 1 packet by mouth 2 (two) times daily.     aspirin 81 MG EC tablet  Take 1 tablet (81 mg total) by mouth daily.     baclofen 10 MG tablet  Commonly known as:  LIORESAL  Take 10 mg by mouth 3 (three) times daily.     BETASERON [place]  Inject 0.3 mg into the skin every other day.     bisacodyl 5 MG EC tablet  Commonly known as:  DULCOLAX  Take 10 mg by mouth daily as needed for moderate constipation.     chlorhexidine 0.12 % solution  Commonly known as:  PERIDEX  Use as directed 10 mLs in the mouth  or throat 2 (two) times daily.     cholecalciferol 1000 units tablet  Commonly known as:  VITAMIN D  Take 2,000 Units by mouth daily.     citalopram 10 MG tablet  Commonly known as:  CELEXA  Take 1 tablet (10 mg total) by mouth daily.     collagenase ointment  Commonly known as:  SANTYL  Apply topically daily.     DECUBI-VITE PO  Take 1 tablet by mouth daily.     divalproex 125 MG capsule  Commonly known as:  DEPAKOTE SPRINKLE  Take 125-500 mg by mouth 2 (two) times daily. The patient takes 125 mg qam and 500 mg at bedtime     divalproex 125 MG capsule  Commonly known as:  DEPAKOTE SPRINKLE  Take 4 capsules (500 mg total) by mouth at bedtime.  docusate sodium 100 MG capsule  Commonly known as:  COLACE  Take 1 capsule (100 mg total) by mouth 2 (two) times daily.     feeding supplement (PRO-STAT SUGAR FREE 64) Liqd  Take 30 mLs by mouth 3 (three) times daily with meals.     fentaNYL 50 MCG/HR  Commonly known as:  DURAGESIC - dosed mcg/hr  Place 1 patch (50 mcg total) onto the skin every 3 (three) days.     lactulose 10 GM/15ML solution  Commonly known as:  CHRONULAC  Take 20 g by mouth 2 (two) times daily as needed for mild constipation.     oxycodone 5 MG capsule  Commonly known as:  OXY-IR  Take 1 capsule (5 mg total) by mouth every 6 (six) hours as needed for pain.     oxycodone 5 MG capsule  Commonly known as:  OXY-IR  Take 1 capsule (5 mg total) by mouth 2 (two) times daily.     potassium chloride SA 20 MEQ tablet  Commonly known as:  K-DUR,KLOR-CON  Take 1 tablet (20 mEq total) by mouth daily.     RESOURCE 2.0 Liqd  Take 60 mLs by mouth 2 (two) times daily.           The results of significant diagnostics from this hospitalization (including imaging, microbiology, ancillary and laboratory) are listed below for reference.     Microbiology: Recent Results (from the past 240 hour(s))  Blood Culture (routine x 2)     Status: None   Collection  Time: 12/18/15 11:36 AM  Result Value Ref Range Status   Specimen Description BLOOD RIGHT ANTECUBITAL  Final   Special Requests BOTTLES DRAWN AEROBIC AND ANAEROBIC 10CC  Final   Culture NO GROWTH 5 DAYS  Final   Report Status 12/23/2015 FINAL  Final  Blood Culture (routine x 2)     Status: None   Collection Time: 12/18/15 11:37 AM  Result Value Ref Range Status   Specimen Description BLOOD LEFT ANTECUBITAL  Final   Special Requests BOTTLES DRAWN AEROBIC AND ANAEROBIC 5CC  Final   Culture NO GROWTH 5 DAYS  Final   Report Status 12/23/2015 FINAL  Final  Urine culture     Status: None   Collection Time: 12/18/15  1:35 PM  Result Value Ref Range Status   Specimen Description URINE, CATHETERIZED  Final   Special Requests NONE  Final   Culture 1,000 COLONIES/mL INSIGNIFICANT GROWTH  Final   Report Status 12/19/2015 FINAL  Final  MRSA PCR Screening     Status: None   Collection Time: 12/18/15  3:22 PM  Result Value Ref Range Status   MRSA by PCR NEGATIVE NEGATIVE Final    Comment:        The GeneXpert MRSA Assay (FDA approved for NASAL specimens only), is one component of a comprehensive MRSA colonization surveillance program. It is not intended to diagnose MRSA infection nor to guide or monitor treatment for MRSA infections.   Urine culture     Status: None   Collection Time: 12/19/15  4:41 AM  Result Value Ref Range Status   Specimen Description URINE, CATHETERIZED  Final   Special Requests NONE  Final   Culture NO GROWTH 1 DAY  Final   Report Status 12/20/2015 FINAL  Final  Urine culture     Status: None   Collection Time: 12/25/15  5:46 PM  Result Value Ref Range Status   Specimen Description URINE, RANDOM  Final   Special  Requests NONE  Final   Culture 9,000 COLONIES/mL INSIGNIFICANT GROWTH  Final   Report Status 12/26/2015 FINAL  Final     Labs: Basic Metabolic Panel:  Recent Labs Lab 12/22/15 0445 12/23/15 0702 12/25/15 0517 12/26/15 0717 12/27/15 0428  NA  145 142 138 139 140  K 3.1* 3.2* 2.6* 3.4* 2.9*  CL 114* 110 105 106 105  CO2 GLUCOSE 98 101* 79 54* 59*  BUN 7 <5* <5* <5* <5*  CREATININE 0.51 0.49 0.30* 0.39* 0.49  CALCIUM 7.9* 7.8* 7.5* 7.4* 7.7*  MG  --  1.6* 1.9  --   --    Liver Function Tests:  Recent Labs Lab 12/21/15 0521  AST 92*  ALT 26  ALKPHOS 61  BILITOT 0.6  PROT 5.3*  ALBUMIN 1.7*   No results for input(s): LIPASE, AMYLASE in the last 168 hours. No results for input(s): AMMONIA in the last 168 hours. CBC:  Recent Labs Lab 12/22/15 0445 12/23/15 0702 12/25/15 0517 12/26/15 0717 12/27/15 0428  WBC 9.2 8.5 9.3 9.5 9.0  HGB 9.1* 8.1* 8.5* 8.3* 8.7*  HCT 28.2* 25.7* 26.1* 25.2* 25.9*  MCV 90.1 87.4 84.5 82.4 84.9  PLT 307 358 527* 580* 609*    CBG:  Recent Labs Lab 12/25/15 0745 12/26/15 0737 12/26/15 0904 12/27/15 0752 12/27/15 0843  GLUCAP 98 66 115* 51* 164*   SIGNED: Time coordinating discharge: 30 minutes  MAGICK-Kaliopi Blyden, MD  [facility] 12/27/2015, 7:38 PM Pager [phone]  If 7PM-7AM, please contact night-coverage [email or web address] Password [email or web address]

## 2015-12-27 NOTE — Care Management Important Message (Signed)
Important Message  Patient Details  Name: Sheena Porter MRN: [id number] Date of Birth: April 12, 1951   Medicare Important Message Given:  Yes    Edgar Corrigan, Derrill Memo, RN 12/27/2015, 10:45 AM

## 2015-12-28 LAB — GLUCOSE, CAPILLARY
GLUCOSE-CAPILLARY: 87 mg/dL (ref 65–99)
Glucose-Capillary: 58 mg/dL — ABNORMAL LOW (ref 65–99)

## 2015-12-28 LAB — MAGNESIUM: MAGNESIUM: 1.9 mg/dL (ref 1.7–2.4)

## 2015-12-28 LAB — BASIC METABOLIC PANEL
Anion gap: 11 (ref 5–15)
CO2: 26 mmol/L (ref 22–32)
CREATININE: 0.37 mg/dL — AB (ref 0.44–1.00)
Calcium: 7.8 mg/dL — ABNORMAL LOW (ref 8.9–10.3)
Chloride: 102 mmol/L (ref 101–111)
GFR calc Af Amer: >60 mL/min (ref >60)
Glucose, Bld: 71 mg/dL (ref 65–99)
Potassium: 3.1 mmol/L — ABNORMAL LOW (ref 3.5–5.1)
SODIUM: 139 mmol/L (ref 135–145)

## 2015-12-28 LAB — CBC
HCT: 26.1 % — ABNORMAL LOW (ref 36.0–46.0)
Hemoglobin: 8.8 g/dL — ABNORMAL LOW (ref 12.0–15.0)
MCH: 28.5 pg (ref 26.0–34.0)
MCHC: 33.7 g/dL (ref 30.0–36.0)
MCV: 84.5 fL (ref 78.0–100.0)
PLATELETS: 629 10*3/uL — AB (ref 150–400)
RBC: 3.09 MIL/uL — ABNORMAL LOW (ref 3.87–5.11)
RDW: 15.1 % (ref 11.5–15.5)
WBC: 9.5 10*3/uL (ref 4.0–10.5)

## 2015-12-28 MED ORDER — FENTANYL 50 MCG/HR TD PT72: 25.0000 ug | MEDICATED_PATCH | TRANSDERMAL | Status: AC

## 2015-12-28 MED ORDER — CITALOPRAM HYDROBROMIDE 10 MG PO TABS: 10.0000 mg | ORAL_TABLET | Freq: Every day | ORAL | Status: AC

## 2015-12-28 MED ORDER — DOCUSATE SODIUM 100 MG PO CAPS
100.0000 mg | ORAL_CAPSULE | Freq: Two times a day (BID) | ORAL | Status: AC
Start: 2015-12-28 — End: ?

## 2015-12-28 MED ORDER — POTASSIUM CHLORIDE CRYS ER 20 MEQ PO TBCR: 20.0000 meq | EXTENDED_RELEASE_TABLET | Freq: Every day | ORAL | Status: AC

## 2015-12-28 MED ORDER — POTASSIUM CHLORIDE CRYS ER 20 MEQ PO TBCR
40.0000 meq | EXTENDED_RELEASE_TABLET | Freq: Once | ORAL | Status: AC
  Administered 2015-12-28: 40 meq via ORAL
  Filled 2015-12-28: qty 2

## 2015-12-28 MED ORDER — DEXTROSE 50 % IV SOLN
INTRAVENOUS | Status: AC
  Filled 2015-12-28: qty 50

## 2015-12-28 MED ORDER — GLUCAGON HCL RDNA (DIAGNOSTIC) 1 MG IJ SOLR
INTRAMUSCULAR | Status: AC
  Administered 2015-12-28: 1 mg
  Filled 2015-12-28: qty 1

## 2015-12-28 MED ORDER — DIVALPROEX SODIUM 125 MG PO CSDR: 500.0000 mg | DELAYED_RELEASE_CAPSULE | Freq: Every day | ORAL | Status: AC

## 2015-12-28 MED ORDER — ASPIRIN 81 MG PO TBEC: 81.0000 mg | DELAYED_RELEASE_TABLET | Freq: Every day | ORAL | Status: AC

## 2015-12-28 MED ORDER — OXYCODONE HCL 5 MG PO CAPS: 5.0000 mg | ORAL_CAPSULE | Freq: Four times a day (QID) | ORAL | Status: AC | PRN

## 2015-12-28 MED ORDER — OXYCODONE HCL 5 MG PO CAPS: 5.0000 mg | ORAL_CAPSULE | Freq: Two times a day (BID) | ORAL | Status: AC

## 2015-12-28 MED ORDER — COLLAGENASE 250 UNIT/GM EX OINT
TOPICAL_OINTMENT | Freq: Every day | CUTANEOUS | Status: AC
Start: 2015-12-28 — End: ?

## 2015-12-28 NOTE — Consult Note (Addendum)
WOC following along, discussed placement of NPWT VAC therapy to the sacral wound yesterday with Hospitalist, however the patient is not able to make decisions herself and it does not appear family has been able to make decisions related to this therapy.  While I think the therapy would benefit the patient, her nutrition is sub optimal for [facility] therapy at this time, she has not been eating while inpatient.  She will need to have her nutrition be a focus along with the [facility] therapy to have any chance of success with the therapy.  Would suggest SNF to continue enzymatic debridement ointment until family and patient make further decisions and nutrition has been optimized.  Will update orders. Will need low air loss for pressure redistribution upon return to SNF.   Re consult if needed, will not follow at this time. Thanks  Ladislao Cohenour [employer], CWOCN [phone])

## 2015-12-28 NOTE — Progress Notes (Signed)
Change dressing on her buttocks.

## 2015-12-28 NOTE — Progress Notes (Signed)
Pt seen and examined at bedside, stable for discharge, spoke with her brother over the phone, please see updated d/c summary from 3/21.  Debbora Presto, MD  [facility] Pager [phone]  If 7PM-7AM, please contact night-coverage [email or web address] Password [email or web address]

## 2015-12-28 NOTE — Progress Notes (Signed)
The previous note is not intended for this patient.Wrong chart entry.

## 2015-12-28 NOTE — Progress Notes (Signed)
Called report to [place] R.N.

## 2015-12-28 NOTE — Clinical Social Work Note (Signed)
Patient medically stable to discharge back to [facility] today. Skilled facility contacted and message left, discharge clinicals transmitted to [facility] via [place]. Call made to patient's son, Iona Coach [phone]) to inform him of patient's discharge and ambulance transport.  Patient will be transported by [facility].  Genelle Bal, MSW, LCSW Licensed Clinical Social Worker Clinical Social Work Department [employer] [phone]

## 2015-12-28 NOTE — Progress Notes (Signed)
Blood sugar dropped to 58 again.Patient was asymptomatic. Alert and verbally responsive.Hypoglycemia protocol implemented.I.V. Infiltrated.Unable to establish new one.Paged I.V  Team.Glucagon I.M. Given.Will monitor.

## 2015-12-28 NOTE — Progress Notes (Signed)
Patient complained of not hearing on the right ear,assessed visually-normal.Patient able to hear well with directions and conversation with me and other staffs. M.D. made aware.No order received.

## 2016-01-03 ENCOUNTER — Encounter: Payer: Self-pay | Admitting: Podiatry

## 2016-01-03 ENCOUNTER — Ambulatory Visit (INDEPENDENT_AMBULATORY_CARE_PROVIDER_SITE_OTHER): Payer: Medicare Other | Admitting: Podiatry

## 2016-01-03 VITALS — BP 104/86 | HR 69 | Resp 12

## 2016-01-03 DIAGNOSIS — M79675 Pain in left toe(s): Secondary | ICD-10-CM

## 2016-01-03 DIAGNOSIS — M79674 Pain in right toe(s): Secondary | ICD-10-CM

## 2016-01-03 DIAGNOSIS — B351 Tinea unguium: Secondary | ICD-10-CM | POA: Diagnosis not present

## 2016-01-03 NOTE — Progress Notes (Signed)
   Subjective:    Patient ID: Sheena Porter, female    DOB: [birth year], 65 y.o.   MRN: [id number]  HPI   This patient presents today on a stretcher transported to theroffice by emergency medical service and is requesting debridement of toenails especially hallux toenails. There is no history of active treatment for this problem EMS has minimal information about the patient and patient was able to respond to the need to have her toenails trimmed.  Patient apparently under treatment for multiple skin lesions on the right and left feet as well as multiple complex medical problems as described in the problem list Patient living in a medical facility  Review of Systems  Skin: Positive for color change.  All other systems reviewed and are negative.      Objective:   Physical Exam  Patient can respond minimally to questioning  Patient is on stretcher with EMS attendant present in room  Vascular: Bilateral peripheral edema DP pulses 2/4 bilaterally PT pulses nonpalpable, however dressings over the medial ankle bilaterally Capillary reflex delayed bilaterally  Neurological: Nonambulatory Patient in supine position with feet in inverted position bilaterally Sensation to 10 g monofilament wire intact 1/5 right 0/4 left Vibratory sensation nonreactive, bilaterally  Dermatological: Foam dressings over medial and lateral right ankle medial right heel Medial lateral left ankle dorsal lateral left foot foam dressings Forefoot is exposed, bilaterally The hallux toenails are approximately 1 inch thick with the left hallux nail with minimal attachment the nailbed The right hallux nail has partial attachment The remaining toenails 8 or elongated and deformed  Musculoskeletal: Nonambulatory patient 1/5 dorsi flexion right 0/5 dorsiflexion left Plantar flexion 0/5 bilaterally Feet in a passive inverted position bilaterally      Assessment & Plan:   Assessment: Nonambulatory  patient on stretcher Vascular status difficult to evaluate as patient has dressings over the posterior tibial arteries Apparent multiple skin wounds under care nursing home 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

## 2016-01-03 NOTE — Patient Instructions (Addendum)
Today the mycotic toenails on the right and left feet were debrided. There was some bleeding in the right hallux nail bed which was treated with topical antibiotic ointment and gauze dressing Apply topical antibiotic ointment such as triple antibiotic ointment daily to the right hallux nail bed and cover with a gauze dressing until a scab forms Return as needed for debridement of mycotic toenails  Carrington Clamp DPM [facility]

## 2016-01-18 ENCOUNTER — Encounter (HOSPITAL_BASED_OUTPATIENT_CLINIC_OR_DEPARTMENT_OTHER): Payer: Medicare Other | Attending: Surgery

## 2016-02-06 DEATH — deceased

## 2017-05-20 IMAGING — CR DG CHEST 1V PORT
1 series · 1 of 1 positions shown · non-contrast
Comparison: No priors.

CLINICAL DATA: 64-year-old female with history of sepsis.

EXAM:
PORTABLE CHEST 1 VIEW

[AP]
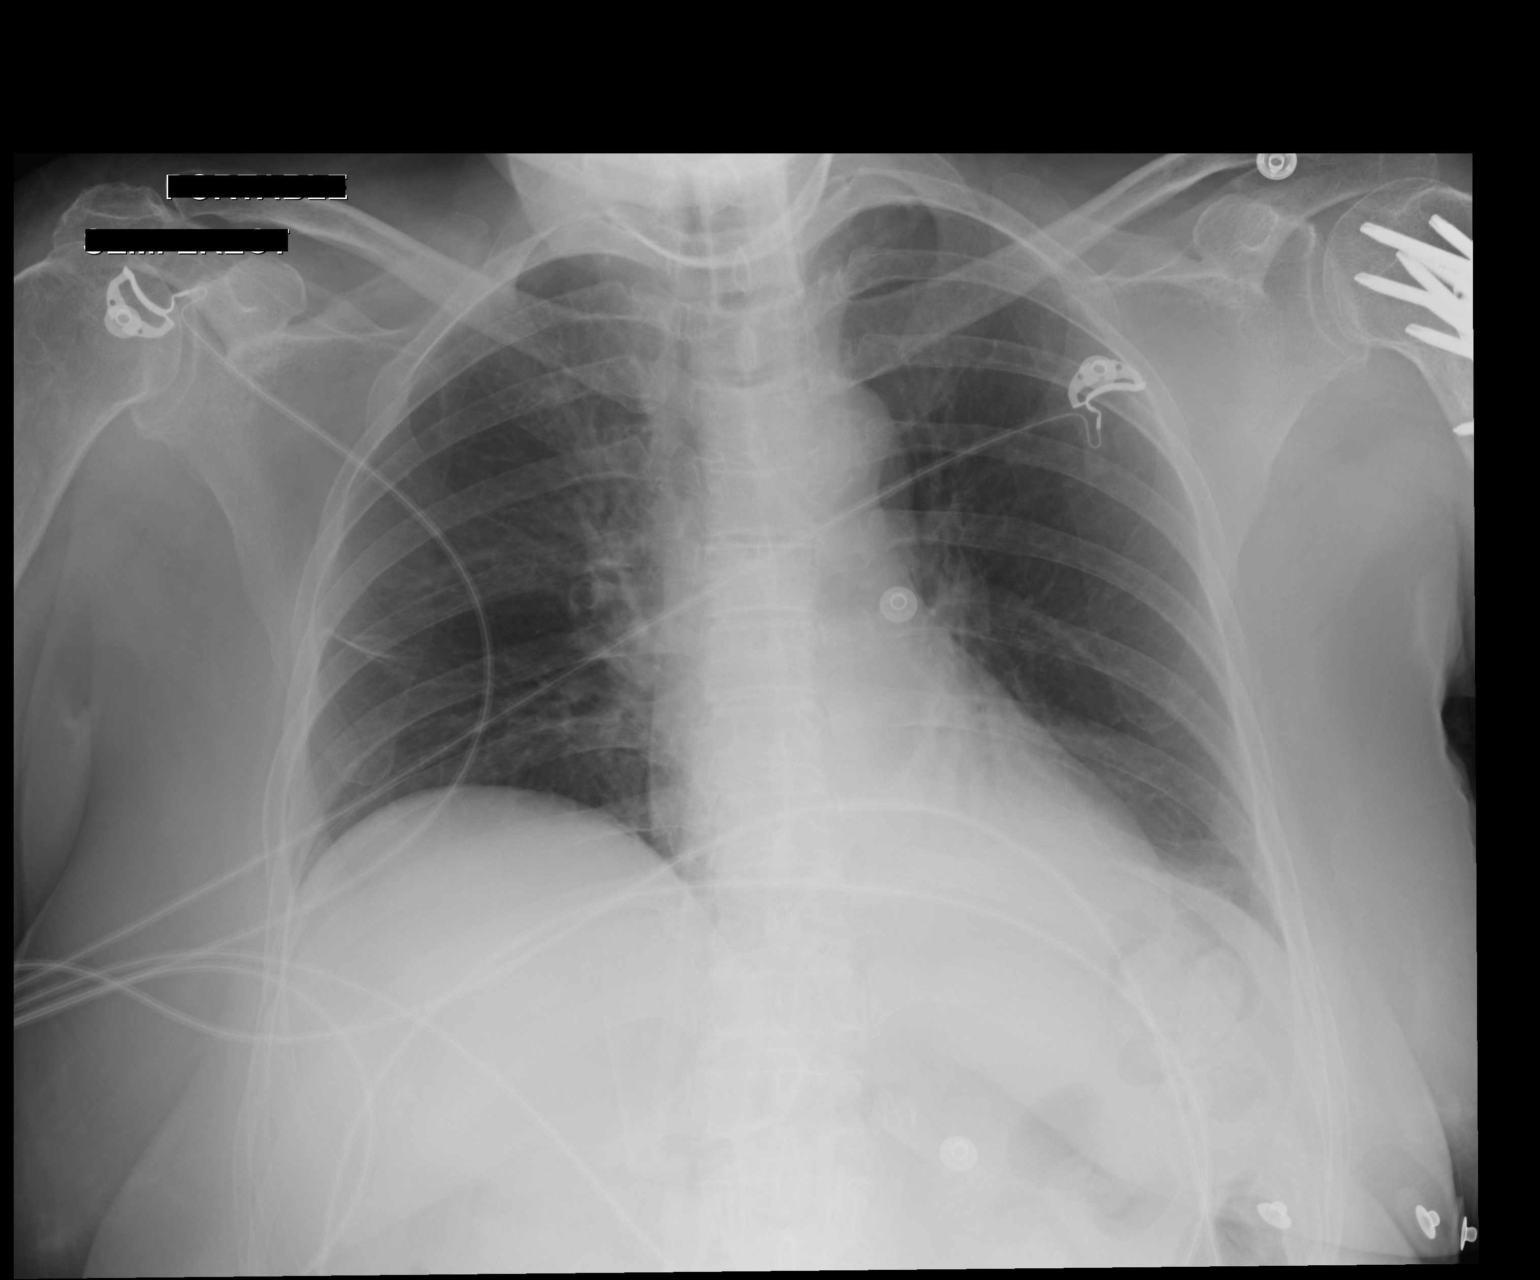

[1 of 1 positions shown; findings below may reference images not displayed]

FINDINGS: Left lower lobe airspace consolidation with air bronchograms. Linear
scarring or subsegmental atelectasis in the right mid lung. No
pleural effusions. No evidence of pulmonary edema. Heart size and
mediastinal contours are within normal limits.
IMPRESSION: 1. Right lower lobe consolidation and air bronchograms, concerning
for pneumonia. Followup PA and lateral chest X-ray is recommended in
3-4 weeks following trial of antibiotic therapy to ensure resolution
and exclude underlying malignancy.

## 2017-05-21 IMAGING — DX DG ABD PORTABLE 1V
1 series · 1 of 1 positions shown · non-contrast
Comparison: None.

CLINICAL DATA: Abdominal pain.

EXAM:
PORTABLE ABDOMEN - 1 VIEW

[abdomen supine]
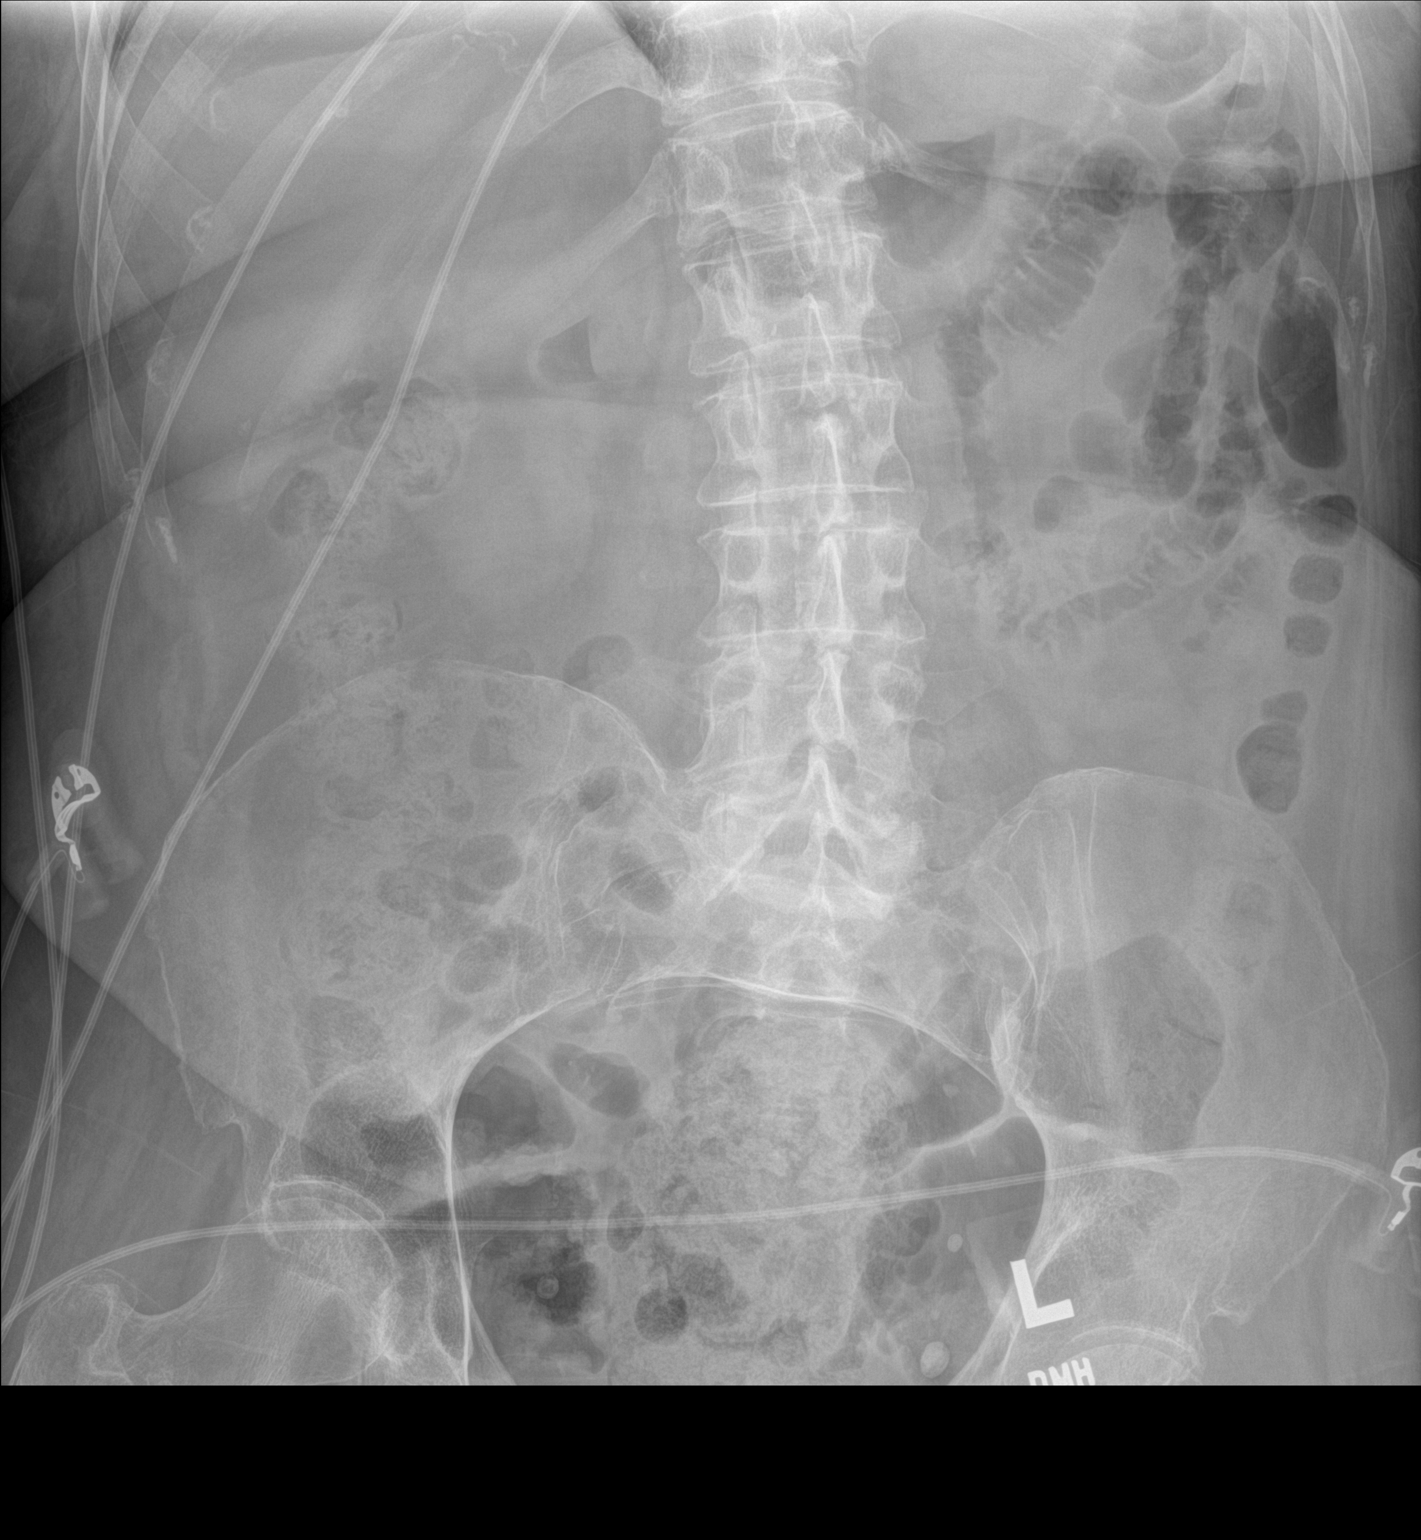

[1 of 1 positions shown; findings below may reference images not displayed]

FINDINGS: No abnormal bowel dilatation is noted. Large amount of stool is
noted in the rectum. Phleboliths are noted in the pelvis.
IMPRESSION: Large amount of stool is noted in the rectum concerning for
impaction. No abnormal bowel dilatation is noted.

## 2017-05-21 IMAGING — DX DG CHEST 1V PORT
1 series · 1 of 1 positions shown · non-contrast
Comparison: Chest radiograph from one day prior.

CLINICAL DATA: Sepsis

EXAM:
PORTABLE CHEST 1 VIEW

[chest ap]
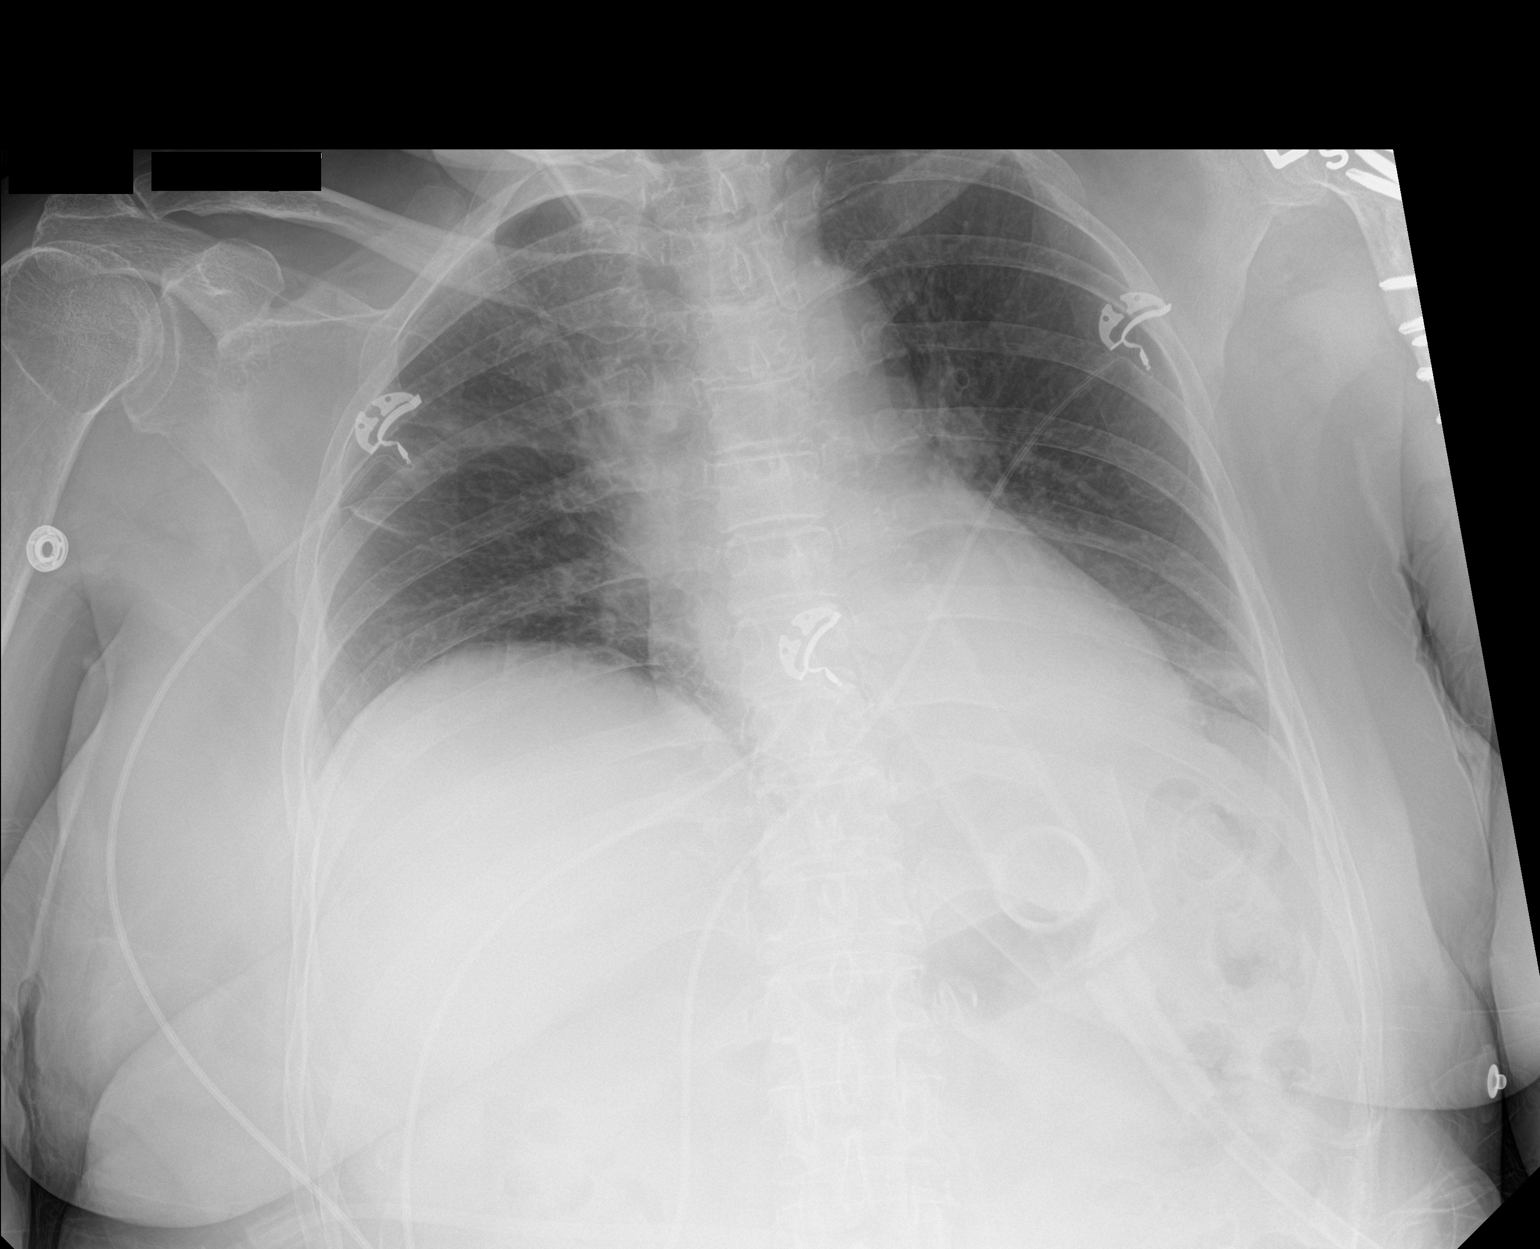

[1 of 1 positions shown; findings below may reference images not displayed]

FINDINGS: Right rotated chest radiograph. Stable cardiomediastinal silhouette
with normal heart size. No pneumothorax. No pleural effusion. Stable
left lower lobe consolidation. Mild patchy right upper parahilar
opacities appear new.
IMPRESSION: Stable left lower lobe consolidation. New mild patchy right upper
parahilar opacities. Differential includes atelectasis and/or
pneumonia.

## 2017-05-23 IMAGING — CR DG CHEST 1V PORT
1 series · 1 of 1 positions shown · non-contrast
Comparison: 12/20/2015

CLINICAL DATA: Fever

EXAM:
PORTABLE CHEST - 1 VIEW

[AP]
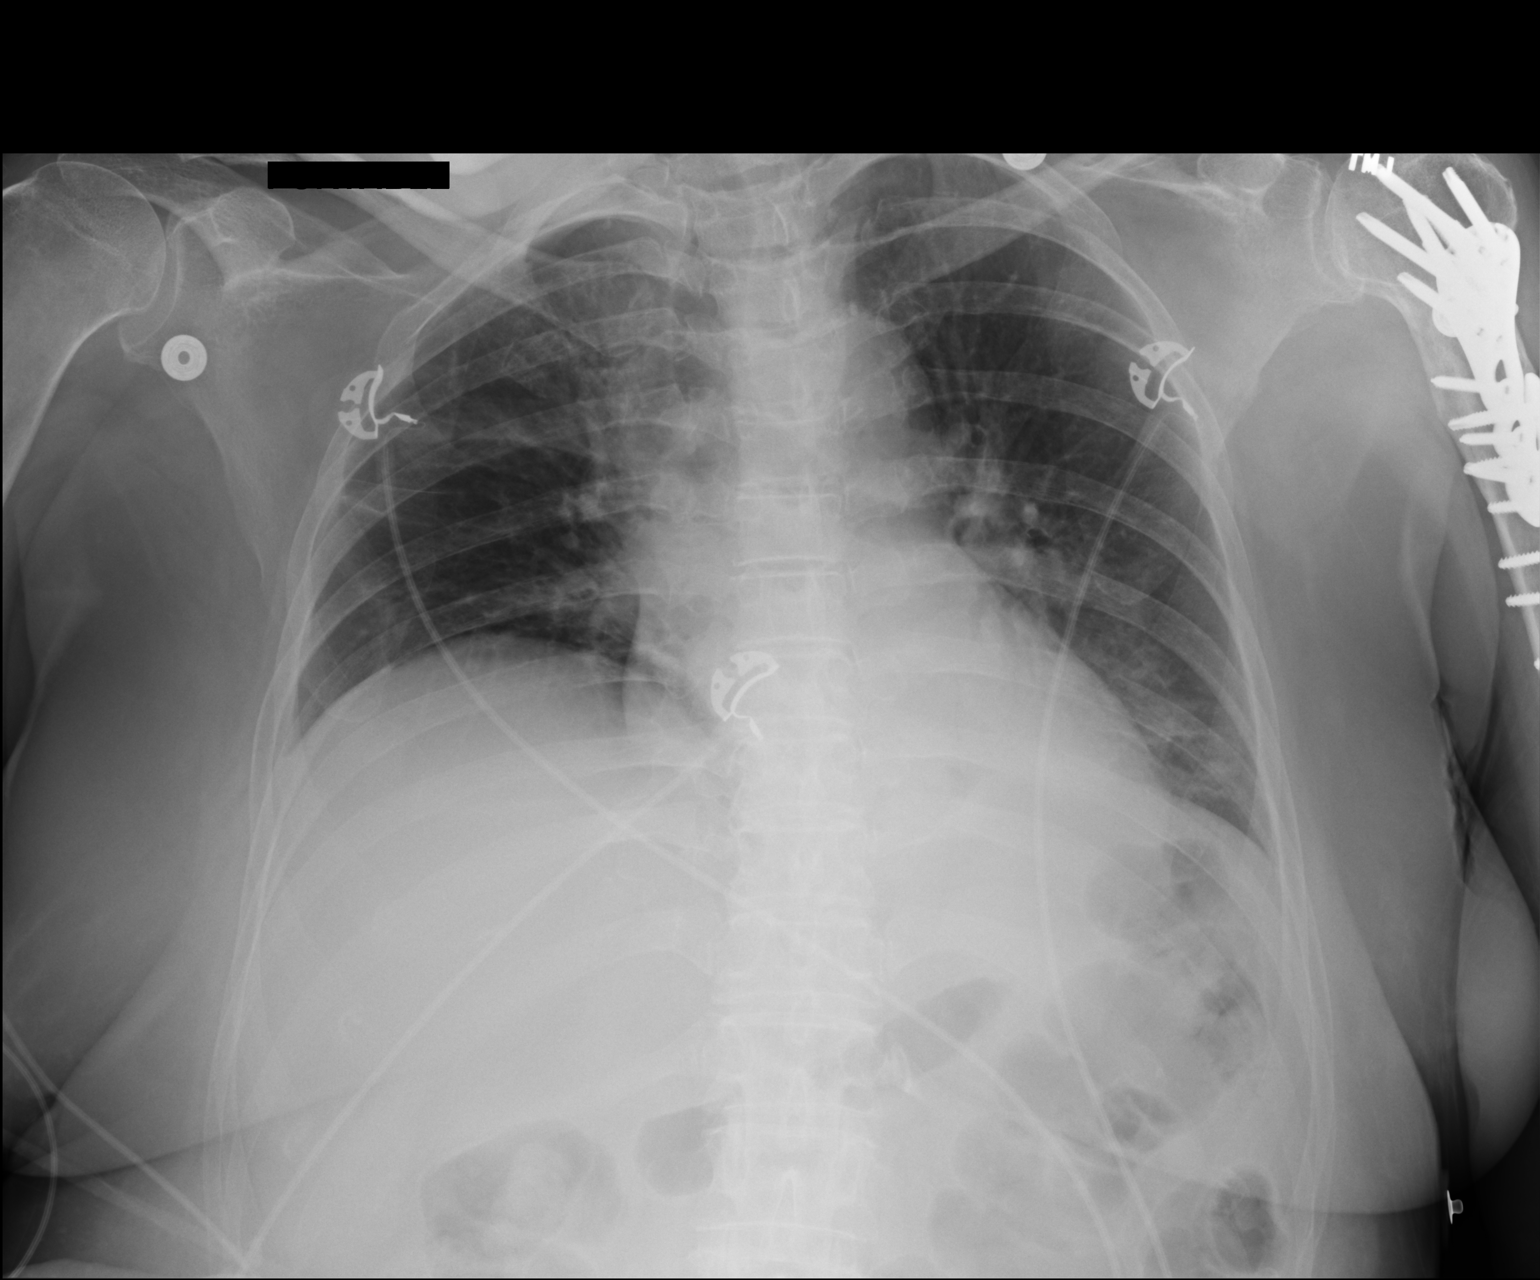

[1 of 1 positions shown; findings below may reference images not displayed]

FINDINGS: Low lung volumes. Left lower lung airspace disease and retrocardiac
consolidation/ atelectasis persists. There is some patchy airspace
opacity in the anterior right upper lobe as before. No new
infiltrate or edema. Heart size upper limits normal for technique.
No effusion.  No pneumothorax.
Fixation hardware in the left humerus, incompletely visualized.
IMPRESSION: 1. Little change in asymmetric left lower lobe and right upper lobe
airspace disease.
